# Patient Record
Sex: Female | Born: 1935 | Race: White | Hispanic: No | Smoking: Former smoker
Health system: Southern US, Community
[De-identification: ages and names within clinical notes are randomized; demographics above are authoritative.]

## PROBLEM LIST (undated history)

## (undated) DIAGNOSIS — I10 Essential (primary) hypertension: Secondary | ICD-10-CM

## (undated) DIAGNOSIS — E785 Hyperlipidemia, unspecified: Secondary | ICD-10-CM

## (undated) DIAGNOSIS — E079 Disorder of thyroid, unspecified: Secondary | ICD-10-CM

## (undated) HISTORY — PX: EYE SURGERY: SHX253

## (undated) HISTORY — PX: APPENDECTOMY: SHX54

---

## 1980-03-20 HISTORY — PX: ABDOMINAL HYSTERECTOMY: SHX81

## 2001-03-20 DIAGNOSIS — I10 Essential (primary) hypertension: Secondary | ICD-10-CM | POA: Insufficient documentation

## 2001-09-30 DIAGNOSIS — M810 Age-related osteoporosis without current pathological fracture: Secondary | ICD-10-CM | POA: Insufficient documentation

## 2003-03-21 DIAGNOSIS — E039 Hypothyroidism, unspecified: Secondary | ICD-10-CM | POA: Insufficient documentation

## 2004-11-15 DIAGNOSIS — E785 Hyperlipidemia, unspecified: Secondary | ICD-10-CM | POA: Insufficient documentation

## 2005-08-24 ENCOUNTER — Ambulatory Visit: Payer: Self-pay

## 2005-09-18 ENCOUNTER — Ambulatory Visit: Payer: Self-pay | Admitting: Gastroenterology

## 2005-10-16 ENCOUNTER — Ambulatory Visit: Payer: Self-pay | Admitting: Gastroenterology

## 2007-04-15 ENCOUNTER — Ambulatory Visit: Payer: Self-pay | Admitting: Family Medicine

## 2007-04-15 LAB — HM DEXA SCAN

## 2008-09-01 ENCOUNTER — Ambulatory Visit: Payer: Self-pay | Admitting: Family Medicine

## 2008-11-04 ENCOUNTER — Ambulatory Visit: Payer: Self-pay | Admitting: Family Medicine

## 2010-12-20 ENCOUNTER — Emergency Department: Payer: Self-pay | Admitting: Emergency Medicine

## 2011-01-04 ENCOUNTER — Ambulatory Visit: Payer: Self-pay | Admitting: Gastroenterology

## 2011-01-04 LAB — HM COLONOSCOPY

## 2011-03-06 ENCOUNTER — Ambulatory Visit: Payer: Self-pay | Admitting: Orthopedic Surgery

## 2012-03-26 ENCOUNTER — Ambulatory Visit: Payer: Self-pay | Admitting: Family Medicine

## 2014-05-13 LAB — BASIC METABOLIC PANEL
BUN: 25 mg/dL — AB (ref 4–21)
Creatinine: 0.7 mg/dL (ref 0.5–1.1)
GLUCOSE: 107 mg/dL
Potassium: 4.7 mmol/L (ref 3.4–5.3)
Sodium: 139 mmol/L (ref 137–147)

## 2014-05-13 LAB — TSH: TSH: 0.78 u[IU]/mL (ref 0.41–5.90)

## 2014-05-13 LAB — LIPID PANEL
Cholesterol: 236 mg/dL — AB (ref 0–200)
HDL: 51 mg/dL (ref 35–70)
LDL CALC: 142 mg/dL
Triglycerides: 216 mg/dL — AB (ref 40–160)

## 2014-06-17 ENCOUNTER — Ambulatory Visit
Admit: 2014-06-17 | Disposition: A | Discharge status: Home / Self Care | Payer: Self-pay | Attending: Ophthalmology | Admitting: Ophthalmology

## 2014-08-20 ENCOUNTER — Other Ambulatory Visit: Payer: Self-pay | Admitting: Family Medicine

## 2014-09-17 ENCOUNTER — Ambulatory Visit: Payer: Self-pay | Admitting: Family Medicine

## 2014-09-22 ENCOUNTER — Ambulatory Visit: Payer: Self-pay | Admitting: Family Medicine

## 2014-10-05 ENCOUNTER — Other Ambulatory Visit: Payer: Self-pay | Admitting: Family Medicine

## 2014-10-15 ENCOUNTER — Other Ambulatory Visit: Payer: Self-pay | Admitting: Family Medicine

## 2015-01-04 ENCOUNTER — Other Ambulatory Visit: Payer: Self-pay | Admitting: Family Medicine

## 2015-02-26 ENCOUNTER — Other Ambulatory Visit: Payer: Self-pay | Admitting: Family Medicine

## 2015-04-17 ENCOUNTER — Other Ambulatory Visit: Payer: Self-pay | Admitting: Family Medicine

## 2015-05-18 ENCOUNTER — Other Ambulatory Visit: Payer: Self-pay | Admitting: Family Medicine

## 2015-05-18 NOTE — Telephone Encounter (Signed)
Please advise patient she needs to schedule follow up office visit before we can refill her medications.

## 2015-05-20 NOTE — Telephone Encounter (Signed)
Left message for patient to call back to schedule an office visit 

## 2015-05-21 DIAGNOSIS — G8929 Other chronic pain: Secondary | ICD-10-CM | POA: Insufficient documentation

## 2015-05-21 DIAGNOSIS — R51 Headache: Secondary | ICD-10-CM

## 2015-05-21 DIAGNOSIS — E559 Vitamin D deficiency, unspecified: Secondary | ICD-10-CM | POA: Insufficient documentation

## 2015-05-21 NOTE — Telephone Encounter (Signed)
Pt returned call and scheduled appt for 05/24/15. Thanks TNP

## 2015-05-24 ENCOUNTER — Encounter: Payer: Self-pay | Admitting: Family Medicine

## 2015-05-24 ENCOUNTER — Ambulatory Visit (INDEPENDENT_AMBULATORY_CARE_PROVIDER_SITE_OTHER): Payer: PPO | Admitting: Family Medicine

## 2015-05-24 VITALS — BP 130/68 | HR 63 | Temp 97.4°F | Resp 18 | Ht 61.0 in | Wt 132.0 lb

## 2015-05-24 DIAGNOSIS — E039 Hypothyroidism, unspecified: Secondary | ICD-10-CM

## 2015-05-24 DIAGNOSIS — R42 Dizziness and giddiness: Secondary | ICD-10-CM | POA: Diagnosis not present

## 2015-05-24 DIAGNOSIS — M8580 Other specified disorders of bone density and structure, unspecified site: Secondary | ICD-10-CM | POA: Diagnosis not present

## 2015-05-24 DIAGNOSIS — E785 Hyperlipidemia, unspecified: Secondary | ICD-10-CM | POA: Diagnosis not present

## 2015-05-24 DIAGNOSIS — E559 Vitamin D deficiency, unspecified: Secondary | ICD-10-CM

## 2015-05-24 DIAGNOSIS — E2839 Other primary ovarian failure: Secondary | ICD-10-CM | POA: Diagnosis not present

## 2015-05-24 DIAGNOSIS — I10 Essential (primary) hypertension: Secondary | ICD-10-CM

## 2015-05-24 NOTE — Progress Notes (Signed)
Patient: Sophia Anderson Female    DOB: [birth year]   80 y.o.   MRN: [id number] Visit Date: 05/24/2015  Today's Provider: Lelon Huh, MD   Chief Complaint  Patient presents with  . Hypertension  . Hypothyroidism  . Hyperlipidemia   Subjective:    HPI   Hypertension, follow-up:  BP Readings from Last 3 Encounters:  05/24/15 130/68  07/07/14 160/60    She was last seen for hypertension 1 years ago.  BP at that visit was 110/60. Management since that visit includes no changes. She reports good compliance with treatment. She is not having side effects.  She is not exercising. She is not adherent to low salt diet.   Outside blood pressures are not being checked. She is experiencing none.  Patient denies chest pain, chest pressure/discomfort, claudication, dyspnea, exertional chest pressure/discomfort, fatigue, irregular heart beat and lower extremity edema.   Cardiovascular risk factors include dyslipidemia and hypertension.  Use of agents associated with hypertension: none.     Weight trend: stable Wt Readings from Last 3 Encounters:  05/24/15 132 lb (59.875 kg)  07/07/14 134 lb (60.782 kg)    Current diet: well balanced  ------------------------------------------------------------------------   Lipid/Cholesterol, Follow-up:   Last seen for this1 years ago.  Management changes since that visit include none. . Last Lipid Panel:    Component Value Date/Time   CHOL 236* 05/13/2014   TRIG 216* 05/13/2014   HDL 51 05/13/2014   [place] 142 05/13/2014    Risk factors for vascular disease include hypercholesterolemia and hypertension  She reports good compliance with treatment. She is not having side effects.  Current symptoms include none and have been stable. Weight trend: stable Prior visit with dietician: no Current diet: well balanced Current exercise: none  Wt Readings from Last 3 Encounters:  05/24/15 132 lb (59.875 kg)  07/07/14 134 lb  (60.782 kg)    ------------------------------------------------------------------- Follow up Hypothyroidism:  Last office visit was 05/13/2014 and no changes were made. Patient reports good compliance with treatment.  Follow up Vitamin D deficiency:  Last office visit was 1 year ago and no changes were made. Patient reports good compliance with treatment.     Allergies  Allergen Reactions  . Fluvastatin Sodium     Heartburn  . Penicillins   . Welchol  [Colesevelam Hcl]     Heartburn   Previous Medications   ALBUTEROL (PROAIR HFA) 108 (90 BASE) MCG/ACT INHALER    Inhale into the lungs.   LEVOTHYROXINE (SYNTHROID, LEVOTHROID) 88 MCG TABLET    Take 1 tablet (88 mcg total) by mouth daily.   MONTELUKAST (SINGULAIR) 10 MG TABLET    TAKE 1 TABLET BY MOUTH DAILY   PROPRANOLOL ER (INDERAL LA) 60 MG 24 HR CAPSULE    Take by mouth.   TRIAMCINOLONE (NASACORT ALLERGY 24HR) 55 MCG/ACT AERO NASAL INHALER    Place into the nose.   VITAMIN D, ERGOCALCIFEROL, (DRISDOL) 50000 UNITS CAPS CAPSULE    TAKE ONE CAPSULE BY MOUTH EVERY WEEK   ZETIA 10 MG TABLET    TAKE 1 TABLET BY MOUTH AT BEDTIME    Review of Systems  Constitutional: Negative for fever, chills, appetite change and fatigue.  HENT: Positive for rhinorrhea.   Respiratory: Negative for chest tightness and shortness of breath.   Cardiovascular: Negative for chest pain and palpitations.  Gastrointestinal: Negative for nausea, vomiting and abdominal pain.  Neurological: Positive for dizziness and light-headedness. Negative for weakness and headaches.  Psychiatric/Behavioral:  Patient has been more forgetful    Social History  Substance Use Topics  . Smoking status: Former Research scientist (life sciences)  . Smokeless tobacco: Not on file  . Alcohol Use: No   Objective:   BP 130/68 mmHg  Pulse 63  Temp(Src) 97.4 F (36.3 C) (Oral)  Resp 18  Ht 5\' 1"  (1.549 m)  Wt 132 lb (59.875 kg)  BMI 24.95 kg/m2  SpO2 100%  Physical Exam   General  Appearance:    Alert, cooperative, no distress  Eyes:    PERRL, conjunctiva/corneas clear, EOM's intact       Lungs:     Clear to auscultation bilaterally, respirations unlabored  Heart:    Regular rate and rhythm  Neurologic:   Awake, alert, oriented x 3. No apparent focal neurological           defect.      EKG: NSR      Assessment & Plan:     1. Essential (primary) hypertension Fairly well controlled on propranolol - EKG 12-Lead  2. Estrogen deficiency Due to BMD - DG Bone Density; Future  3. Hypothyroidism, unspecified hypothyroidism type  - TSH  4. Vitamin D deficiency - VITAMIN D 25 Hydroxy (Vit-D Deficiency, Fractures)  5. Osteopenia   6. HLD (hyperlipidemia)  - Lipid panel  7. Dizziness Resolved today.  - Comprehensive metabolic panel - CBC  8. Chronic headaches.  Well controlled on current betablocker  9. Forgetfulness.  Advised that this could be aggravated by betablocker, but her headaches have been so well controlled she is not anxious to change BP medications for now.       Lelon Huh, MD  [facility]

## 2015-05-25 ENCOUNTER — Encounter: Payer: Self-pay | Admitting: Family Medicine

## 2015-05-25 LAB — VITAMIN D 25 HYDROXY (VIT D DEFICIENCY, FRACTURES): VIT D 25 HYDROXY: 45.8 ng/mL (ref 30.0–100.0)

## 2015-05-25 LAB — CBC
Hematocrit: 40.2 % (ref 34.0–46.6)
Hemoglobin: 13.8 g/dL (ref 11.1–15.9)
MCH: 28.6 pg (ref 26.6–33.0)
MCHC: 34.3 g/dL (ref 31.5–35.7)
MCV: 83 fL (ref 79–97)
PLATELETS: 272 10*3/uL (ref 150–379)
RBC: 4.82 x10E6/uL (ref 3.77–5.28)
RDW: 14.6 % (ref 12.3–15.4)
WBC: 6.3 10*3/uL (ref 3.4–10.8)

## 2015-05-25 LAB — COMPREHENSIVE METABOLIC PANEL
ALK PHOS: 81 IU/L (ref 39–117)
ALT: 20 IU/L (ref 0–32)
AST: 23 IU/L (ref 0–40)
Albumin/Globulin Ratio: 1.6 (ref 1.1–2.5)
Albumin: 4.4 g/dL (ref 3.5–4.8)
BILIRUBIN TOTAL: 0.7 mg/dL (ref 0.0–1.2)
BUN / CREAT RATIO: 30 — AB (ref 11–26)
BUN: 22 mg/dL (ref 8–27)
CHLORIDE: 100 mmol/L (ref 96–106)
CO2: 23 mmol/L (ref 18–29)
CREATININE: 0.73 mg/dL (ref 0.57–1.00)
Calcium: 9.7 mg/dL (ref 8.7–10.3)
GFR calc Af Amer: 92 mL/min/{1.73_m2} (ref >59)
GFR calc non Af Amer: 80 mL/min/{1.73_m2} (ref >59)
GLOBULIN, TOTAL: 2.8 g/dL (ref 1.5–4.5)
Glucose: 115 mg/dL — ABNORMAL HIGH (ref 65–99)
Potassium: 4.8 mmol/L (ref 3.5–5.2)
SODIUM: 140 mmol/L (ref 134–144)
Total Protein: 7.2 g/dL (ref 6.0–8.5)

## 2015-05-25 LAB — TSH: TSH: 1.39 u[IU]/mL (ref 0.450–4.500)

## 2015-05-25 LAB — LIPID PANEL
CHOLESTEROL TOTAL: 262 mg/dL — AB (ref 100–199)
Chol/HDL Ratio: 4.5 ratio units — ABNORMAL HIGH (ref 0.0–4.4)
HDL: 58 mg/dL (ref >39)
LDL CALC: 172 mg/dL — AB (ref 0–99)
TRIGLYCERIDES: 161 mg/dL — AB (ref 0–149)
VLDL CHOLESTEROL CAL: 32 mg/dL (ref 5–40)

## 2015-05-31 ENCOUNTER — Encounter: Payer: Self-pay | Admitting: Family Medicine

## 2015-06-14 ENCOUNTER — Ambulatory Visit: Payer: Self-pay | Attending: Family Medicine

## 2015-06-18 ENCOUNTER — Other Ambulatory Visit: Payer: Self-pay | Admitting: Family Medicine

## 2015-06-19 ENCOUNTER — Other Ambulatory Visit: Payer: Self-pay | Admitting: Family Medicine

## 2015-09-27 ENCOUNTER — Ambulatory Visit (INDEPENDENT_AMBULATORY_CARE_PROVIDER_SITE_OTHER): Payer: PPO | Admitting: Family Medicine

## 2015-09-27 VITALS — BP 152/78 | HR 68 | Temp 98.2°F | Resp 16 | Wt 135.0 lb

## 2015-09-27 DIAGNOSIS — E785 Hyperlipidemia, unspecified: Secondary | ICD-10-CM

## 2015-09-27 DIAGNOSIS — R7303 Prediabetes: Secondary | ICD-10-CM | POA: Diagnosis not present

## 2015-09-27 DIAGNOSIS — R739 Hyperglycemia, unspecified: Secondary | ICD-10-CM | POA: Diagnosis not present

## 2015-09-27 LAB — POCT GLYCOSYLATED HEMOGLOBIN (HGB A1C)
ESTIMATED AVERAGE GLUCOSE: 123
Hemoglobin A1C: 5.9

## 2015-09-27 NOTE — Progress Notes (Signed)
Patient: Sophia Anderson Female    DOB: [birth year]   80 y.o.   MRN: [id number] Visit Date: 09/27/2015  Today's Provider: Lelon Huh, MD   Chief Complaint  Patient presents with  . Hypertension    follow up  . Hypothyroidism    follow up  . Hyperlipidemia    follow up  . Hyperglycemia    follow up   Subjective:    HPI  Hypertension, follow-up:  BP Readings from Last 3 Encounters:  09/27/15 152/78  05/24/15 130/68  07/07/14 160/60    She was last seen for hypertension 4 months ago.  BP at that visit was 130/68. Management since that visit includes no changes. She reports good compliance with treatment. She is not having side effects.  She is not exercising. She is not adherent to low salt diet.   Outside blood pressures are not being checked. She is experiencing dyspnea, fatigue and lower extremity edema.  Patient denies chest pain, claudication, irregular heart beat, near-syncope, orthopnea, palpitations, paroxysmal nocturnal dyspnea, syncope and tachypnea.   Cardiovascular risk factors include advanced age (older than 36 for men, 53 for women), dyslipidemia and hypertension.  Use of agents associated with hypertension: thyroid hormones.     Weight trend: stable Wt Readings from Last 3 Encounters:  09/27/15 135 lb (61.236 kg)  05/24/15 132 lb (59.875 kg)  07/07/14 134 lb (60.782 kg)    Current diet: in general, an "unhealthy" diet  ------------------------------------------------------------------------  Follow up Hypothyroidism: Last office visit was 4 months ago and no changes were made. Patient reports good complaince with treatment, good tolerance and good symptom control.   Lab Results  Component Value Date   TSH 1.390 05/24/2015        Lipid/Cholesterol, Follow-up:   Last seen for this4 months ago.  Management changes since that visit include advising patient to cut back on saturated fats and continue Zetia. . Last Lipid Panel:      Component Value Date/Time   CHOL 262* 05/24/2015 1103   CHOL 236* 05/13/2014   TRIG 161* 05/24/2015 1103   HDL 58 05/24/2015 1103   HDL 51 05/13/2014   CHOLHDL 4.5* 05/24/2015 1103   LDLCALC 172* 05/24/2015 1103   LDLCALC 142 05/13/2014    Risk factors for vascular disease include hypercholesterolemia and hypertension  She reports good compliance with treatment. States she is taking Zetia every day with no adverse effects.  She is not having side effects.   Weight trend: stable Prior visit with dietician: no Current diet: in general, an "unhealthy" diet Current exercise: none  Wt Readings from Last 3 Encounters:  09/27/15 135 lb (61.236 kg)  05/24/15 132 lb (59.875 kg)  07/07/14 134 lb (60.782 kg)    -------------------------------------------------------------------  Hyperglycemia, Follow-up:   Lab Results  Component Value Date   GLUCOSE 115* 05/24/2015    Last seen for for this 4 months ago.  Management since then includes advising patient to avoid sweets and starchy foods. Current symptoms include burning in the bottom of her feet at night and have been stable.  Weight trend: stable Prior visit with dietician: no Current diet: in general, an "unhealthy" diet Current exercise: none  Pertinent Labs:    Component Value Date/Time   CHOL 262* 05/24/2015 1103   CHOL 236* 05/13/2014   TRIG 161* 05/24/2015 1103   CHOLHDL 4.5* 05/24/2015 1103   CREATININE 0.73 05/24/2015 1103   CREATININE 0.7 05/13/2014    Wt Readings from Last  3 Encounters:  09/27/15 135 lb (61.236 kg)  05/24/15 132 lb (59.875 kg)  07/07/14 134 lb (60.782 kg)         Allergies  Allergen Reactions  . Fluvastatin Sodium     Heartburn  . Penicillins   . Welchol  [Colesevelam Hcl]     Heartburn   Current Meds  Medication Sig  . albuterol (PROAIR HFA) 108 (90 Base) MCG/ACT inhaler Inhale into the lungs.  Marland Kitchen levothyroxine (SYNTHROID, LEVOTHROID) 88 MCG tablet Take 1 tablet (88 mcg  total) by mouth daily.  . montelukast (SINGULAIR) 10 MG tablet TAKE 1 TABLET BY MOUTH DAILY  . propranolol ER (INDERAL LA) 60 MG 24 hr capsule TAKE ONE CAPSULE BY MOUTH EVERY DAY  . triamcinolone (NASACORT ALLERGY 24HR) 55 MCG/ACT AERO nasal inhaler Place into the nose.  . Vitamin D, Ergocalciferol, (DRISDOL) 50000 UNITS CAPS capsule TAKE ONE CAPSULE BY MOUTH EVERY WEEK  . ZETIA 10 MG tablet TAKE 1 TABLET BY MOUTH AT BEDTIME    Review of Systems  Constitutional: Negative for fever, chills, appetite change and fatigue.  Respiratory: Positive for shortness of breath (when climbing stairs). Negative for chest tightness.   Cardiovascular: Positive for leg swelling (lower right leg). Negative for chest pain and palpitations.  Gastrointestinal: Negative for nausea, vomiting and abdominal pain.  Musculoskeletal: Positive for arthralgias (right knee pain when walking or driving a car).  Neurological: Negative for dizziness and weakness.       Burning pain in feet at night.     Social History  Substance Use Topics  . Smoking status: Former Research scientist (life sciences)  . Smokeless tobacco: Not on file     Comment: Smoked < 1 ppd and quit many years ago  . Alcohol Use: No   Objective:   BP 152/78 mmHg  Pulse 68  Temp(Src) 98.2 F (36.8 C) (Oral)  Resp 16  Wt 135 lb (61.236 kg)  Physical Exam   General Appearance:    Alert, cooperative, no distress  Eyes:    PERRL, conjunctiva/corneas clear, EOM's intact       Lungs:     Clear to auscultation bilaterally, respirations unlabored  Heart:    Regular rate and rhythm  Neurologic:   Awake, alert, oriented x 3. No apparent focal neurological           defect.       Results for orders placed or performed in visit on 09/27/15  POCT HgB A1C  Result Value Ref Range   Hemoglobin A1C 5.9    Est. average glucose Bld gHb Est-mCnc 123        Assessment & Plan:     1. Hyperglycemia Counseled on healthy low glycemic index diet and exercise daily - POCT HgB  A1C  2. HLD (hyperlipidemia) Uncontrolled when last checked. Has tried to make some improvments with diet.  - Lipid panel  3. Prediabetes Check A1c 1-2 times per year.   4. Knee swelling Is not having any pain and swelling has mostly resolved. She thinks she may have twisted it. Advised to call if not continue to steadily improve.       Lelon Huh, MD  [facility]

## 2015-09-28 DIAGNOSIS — E785 Hyperlipidemia, unspecified: Secondary | ICD-10-CM | POA: Diagnosis not present

## 2015-09-29 LAB — LIPID PANEL
CHOLESTEROL TOTAL: 244 mg/dL — AB (ref 100–199)
Chol/HDL Ratio: 5.2 ratio units — ABNORMAL HIGH (ref 0.0–4.4)
HDL: 47 mg/dL (ref >39)
LDL Calculated: 147 mg/dL — ABNORMAL HIGH (ref 0–99)
TRIGLYCERIDES: 249 mg/dL — AB (ref 0–149)
VLDL Cholesterol Cal: 50 mg/dL — ABNORMAL HIGH (ref 5–40)

## 2015-10-05 ENCOUNTER — Telehealth: Payer: Self-pay

## 2015-10-05 NOTE — Telephone Encounter (Signed)
Pt advised.   Thanks,   -Travis Purk  

## 2015-10-05 NOTE — Telephone Encounter (Signed)
-----   Message from Birdie Sons, MD sent at 10/03/2015  9:15 PM EDT ----- LDL cholesterol is improved from 172 to 147. Continue current dose of Zetia. Check yearly.

## 2015-10-18 ENCOUNTER — Other Ambulatory Visit: Payer: Self-pay | Admitting: Family Medicine

## 2016-02-11 ENCOUNTER — Other Ambulatory Visit: Payer: Self-pay | Admitting: Family Medicine

## 2016-02-17 ENCOUNTER — Other Ambulatory Visit: Payer: Self-pay | Admitting: Family Medicine

## 2016-03-15 ENCOUNTER — Ambulatory Visit (INDEPENDENT_AMBULATORY_CARE_PROVIDER_SITE_OTHER): Payer: PPO

## 2016-03-15 VITALS — BP 130/58 | HR 72 | Temp 97.7°F | Ht 61.0 in | Wt 135.0 lb

## 2016-03-15 DIAGNOSIS — Z23 Encounter for immunization: Secondary | ICD-10-CM

## 2016-03-15 DIAGNOSIS — Z0000 Encounter for general adult medical examination without abnormal findings: Secondary | ICD-10-CM | POA: Diagnosis not present

## 2016-03-15 NOTE — Patient Instructions (Signed)
Sophia Anderson , Thank you for taking time to come for your Medicare Wellness Visit. I appreciate your ongoing commitment to your health goals. Please review the following plan we discussed and let me know if I can assist you in the future.   These are the goals we discussed: Goals    . Increase water intake          Starting 03/15/16, I will increase my water intake to 3 glasses a day.       This is a list of the screening recommended for you and due dates:  Health Maintenance  Topic Date Due  . Tetanus Vaccine  09/16/1956  . Shingles Vaccine  09/16/1997  . Flu Shot  10/19/2015  . DEXA scan (bone density measurement)  Completed  . Pneumonia vaccines  Completed   Preventive Care for Adults  A healthy lifestyle and preventive care can promote health and wellness. Preventive health guidelines for adults include the following key practices.  . A routine yearly physical is a good way to check with your health care provider about your health and preventive screening. It is a chance to share any concerns and updates on your health and to receive a thorough exam.  . Visit your dentist for a routine exam and preventive care every 6 months. Brush your teeth twice a day and floss once a day. Good oral hygiene prevents tooth decay and gum disease.  . The frequency of eye exams is based on your age, health, family medical history, use  of contact lenses, and other factors. Follow your health care provider's ecommendations for frequency of eye exams.  . Eat a healthy diet. Foods like vegetables, fruits, whole grains, low-fat dairy products, and lean protein foods contain the nutrients you need without too many calories. Decrease your intake of foods high in solid fats, added sugars, and salt. Eat the right amount of calories for you. Get information about a proper diet from your health care provider, if necessary.  . Regular physical exercise is one of the most important things you can do for your  health. Most adults should get at least 150 minutes of moderate-intensity exercise (any activity that increases your heart rate and causes you to sweat) each week. In addition, most adults need muscle-strengthening exercises on 2 or more days a week.  Silver Sneakers may be a benefit available to you. To determine eligibility, you may visit the website: www.silversneakers.com or contact program at [phone] Mon-Fri between 8AM-8PM.   . Maintain a healthy weight. The body mass index (BMI) is a screening tool to identify possible weight problems. It provides an estimate of body fat based on height and weight. Your health care provider can find your BMI and can help you achieve or maintain a healthy weight.   For adults 20 years and older: ? A BMI below 18.5 is considered underweight. ? A BMI of 18.5 to 24.9 is normal. ? A BMI of 25 to 29.9 is considered overweight. ? A BMI of 30 and above is considered obese.   . Maintain normal blood lipids and cholesterol levels by exercising and minimizing your intake of saturated fat. Eat a balanced diet with plenty of fruit and vegetables. Blood tests for lipids and cholesterol should begin at age 1 and be repeated every 5 years. If your lipid or cholesterol levels are high, you are over 50, or you are at high risk for heart disease, you may need your cholesterol levels checked more frequently. Ongoing  high lipid and cholesterol levels should be treated with medicines if diet and exercise are not working.  . If you smoke, find out from your health care provider how to quit. If you do not use tobacco, please do not start.  . If you choose to drink alcohol, please do not consume more than 2 drinks per day. One drink is considered to be 12 ounces (355 mL) of beer, 5 ounces (148 mL) of wine, or 1.5 ounces (44 mL) of liquor.  . If you are 82-45 years old, ask your health care provider if you should take aspirin to prevent strokes.  . Use sunscreen. Apply  sunscreen liberally and repeatedly throughout the day. You should seek shade when your shadow is shorter than you. Protect yourself by wearing long sleeves, pants, a wide-brimmed hat, and sunglasses year round, whenever you are outdoors.  . Once a month, do a whole body skin exam, using a mirror to look at the skin on your back. Tell your health care provider of new moles, moles that have irregular borders, moles that are larger than a pencil eraser, or moles that have changed in shape or color.

## 2016-03-15 NOTE — Progress Notes (Signed)
Subjective:   Sophia Anderson is a 80 y.o. female who presents for Medicare Annual (Subsequent) preventive examination.  Review of Systems:  N/A  Cardiac Risk Factors include: advanced age (>54men, >50 women);dyslipidemia;hypertension     Objective:     Vitals: BP (!) 130/58 (BP Location: Right Arm)   Pulse 72   Temp 97.7 F (36.5 C) (Oral)   Ht 5\' 1"  (1.549 m)   Wt 135 lb (61.2 kg)   BMI 25.51 kg/m   Body mass index is 25.51 kg/m.   Tobacco History  Smoking Status  . Former Smoker  . Types: Cigarettes  Smokeless Tobacco  . Never Used    Comment: Smoked < 1 ppd and quit 30+ years ago     Counseling given: Not Answered   History reviewed. No pertinent past medical history. Past Surgical History:  Procedure Laterality Date  . ABDOMINAL HYSTERECTOMY  1982   vaginal and oophorectomy with incidental appendectomy  . APPENDECTOMY     accidental durin hysterectomy and oophorectomy  . EYE SURGERY     left eye cataract surgery   Family History  Problem Relation Age of Onset  . Emphysema Father   . Congestive Heart Failure Sister    History  Sexual Activity  . Sexual activity: Not on file    Outpatient Encounter Prescriptions as of 03/15/2016  Medication Sig  . ezetimibe (ZETIA) 10 MG tablet TAKE 1 TABLET BY MOUTH AT BEDTIME  . levothyroxine (SYNTHROID, LEVOTHROID) 88 MCG tablet TAKE 1 TABLET (88 MCG TOTAL) BY MOUTH DAILY.  Marland Kitchen propranolol ER (INDERAL LA) 60 MG 24 hr capsule TAKE ONE CAPSULE BY MOUTH EVERY DAY  . triamcinolone (NASACORT ALLERGY 24HR) 55 MCG/ACT AERO nasal inhaler Place into the nose.   . Vitamin D, Ergocalciferol, (DRISDOL) 50000 units CAPS capsule TAKE ONE CAPSULE BY MOUTH ONCE WEEKLY  . albuterol (PROAIR HFA) 108 (90 Base) MCG/ACT inhaler Inhale into the lungs.  . montelukast (SINGULAIR) 10 MG tablet TAKE 1 TABLET BY MOUTH DAILY (Patient not taking: Reported on 03/15/2016)   No facility-administered encounter medications on file as of  03/15/2016.     Activities of Daily Living In your present state of health, do you have any difficulty performing the following activities: 03/15/2016  Hearing? N  Vision? Y  Difficulty concentrating or making decisions? Y  Walking or climbing stairs? N  Dressing or bathing? N  Doing errands, shopping? N  Preparing Food and eating ? N  Using the Toilet? N  In the past six months, have you accidently leaked urine? N  Do you have problems with loss of bowel control? N  Managing your Medications? N  Managing your Finances? N  Housekeeping or managing your Housekeeping? N  Some recent data might be hidden    Patient Care Team: Birdie Sons, MD as PCP - General (Family Medicine)    Assessment:     Exercise Activities and Dietary recommendations Current Exercise Habits: The patient has a physically strenous job, but has no regular exercise apart from work., Exercise limited by: None identified (busy watching grandson)  Goals    . Increase water intake          Starting 03/15/16, I will increase my water intake to 3 glasses a day.      Fall Risk Fall Risk  03/15/2016  Falls in the past year? No   Depression Screen PHQ 2/9 Scores 03/15/2016  PHQ - 2 Score 1     Cognitive Function  6CIT Screen 03/15/2016  What Year? 0 points  What month? 0 points  What time? 0 points  Count back from 20 0 points  Months in reverse 4 points  Repeat phrase 4 points  Total Score 8    Immunization History  Administered Date(s) Administered  . Influenza, High Dose Seasonal PF 03/15/2016  . Pneumococcal Conjugate-13 09/12/2013  . Pneumococcal Polysaccharide-23 02/27/2011   Screening Tests Health Maintenance  Topic Date Due  . TETANUS/TDAP  03/15/2017 (Originally 09/16/1954)  . ZOSTAVAX  03/15/2026 (Originally 09/16/1997)  . INFLUENZA VACCINE  Completed  . DEXA SCAN  Completed  . PNA vac Low Risk Adult  Completed      Plan:  I have personally reviewed and addressed the  Medicare Annual Wellness questionnaire and have noted the following in the patient's chart:  A. Medical and social history B. Use of alcohol, tobacco or illicit drugs  C. Current medications and supplements D. Functional ability and status E.  Nutritional status F.  Physical activity G. Advance directives H. List of other physicians I.  Hospitalizations, surgeries, and ER visits in previous 12 months J.  [place] such as hearing and vision if needed, cognitive and depression L. Referrals and appointments - none  In addition, I have reviewed and discussed with patient certain preventive protocols, quality metrics, and best practice recommendations. A written personalized care plan for preventive services as well as general preventive health recommendations were provided to patient.  See attached scanned questionnaire for additional information.   Signed,  Fabio Neighbors, LPN Nurse Health Advisor   MD Recommendations: follow up on tdap vaccine. Pt declined today.   I have reviewed the health advisor's note, was available for consultation, and agree with documentation and plan  Lelon Huh, MD

## 2016-04-06 ENCOUNTER — Encounter: Payer: PPO | Admitting: Family Medicine

## 2016-05-14 ENCOUNTER — Other Ambulatory Visit: Payer: Self-pay | Admitting: Family Medicine

## 2016-08-25 ENCOUNTER — Other Ambulatory Visit: Payer: Self-pay | Admitting: Family Medicine

## 2016-10-20 ENCOUNTER — Other Ambulatory Visit: Payer: Self-pay | Admitting: Family Medicine

## 2016-10-23 ENCOUNTER — Other Ambulatory Visit: Payer: Self-pay | Admitting: Family Medicine

## 2016-11-23 ENCOUNTER — Other Ambulatory Visit: Payer: Self-pay | Admitting: Family Medicine

## 2017-02-02 ENCOUNTER — Telehealth: Payer: Self-pay | Admitting: Family Medicine

## 2017-02-12 ENCOUNTER — Ambulatory Visit (INDEPENDENT_AMBULATORY_CARE_PROVIDER_SITE_OTHER): Payer: PPO | Admitting: Family Medicine

## 2017-02-12 DIAGNOSIS — Z23 Encounter for immunization: Secondary | ICD-10-CM | POA: Diagnosis not present

## 2017-02-12 NOTE — Progress Notes (Signed)
Flu shot only

## 2017-02-19 NOTE — Telephone Encounter (Signed)
Pt returned call & is scheduled for CPE on 02/23/17. Thanks TNP

## 2017-02-20 ENCOUNTER — Telehealth: Payer: Self-pay | Admitting: Family Medicine

## 2017-02-22 ENCOUNTER — Other Ambulatory Visit: Payer: Self-pay | Admitting: Family Medicine

## 2017-02-22 ENCOUNTER — Ambulatory Visit (INDEPENDENT_AMBULATORY_CARE_PROVIDER_SITE_OTHER): Payer: PPO

## 2017-02-22 VITALS — BP 158/72 | HR 68 | Temp 98.7°F | Ht 61.0 in | Wt 132.6 lb

## 2017-02-22 DIAGNOSIS — Z0000 Encounter for general adult medical examination without abnormal findings: Secondary | ICD-10-CM | POA: Diagnosis not present

## 2017-02-22 NOTE — Patient Instructions (Signed)
Sophia Anderson , Thank you for taking time to come for your Medicare Wellness Visit. I appreciate your ongoing commitment to your health goals. Please review the following plan we discussed and let me know if I can assist you in the future.   Screening recommendations/referrals: Colonoscopy: up to date Recommended yearly ophthalmology/optometry visit for glaucoma screening and checkup Recommended yearly dental visit for hygiene and checkup  Vaccinations: Influenza vaccine: up to date Pneumococcal vaccine: up to date Tdap vaccine: up to date Shingles vaccine: declined  Advanced directives: Please bring a copy of your POA (Power of Attorney) and/or Living Will to your next appointment.   Conditions/risks identified: Recommend increasing water intake to 4 glasses a day.   Next appointment: 02/23/17 @ 10:00 AM  Preventive Care 65 Years and Older, Female Preventive care refers to lifestyle choices and visits with your health care provider that can promote health and wellness. What does preventive care include?  A yearly physical exam. This is also called an annual well check.  Dental exams once or twice a year.  Routine eye exams. Ask your health care provider how often you should have your eyes checked.  Personal lifestyle choices, including:  Daily care of your teeth and gums.  Regular physical activity.  Eating a healthy diet.  Avoiding tobacco and drug use.  Limiting alcohol use.  Practicing safe sex.  Taking low doses of aspirin every day.  Taking vitamin and mineral supplements as recommended by your health care provider. What happens during an annual well check? The services and screenings done by your health care provider during your annual well check will depend on your age, overall health, lifestyle risk factors, and family history of disease. Counseling  Your health care provider may ask you questions about your:  Alcohol use.  Tobacco use.  Drug  use.  Emotional well-being.  Home and relationship well-being.  Sexual activity.  Eating habits.  History of falls.  Memory and ability to understand (cognition).  Work and work Statistician. Screening  You may have the following tests or measurements:  Height, weight, and BMI.  Blood pressure.  Lipid and cholesterol levels. These may be checked every 5 years, or more frequently if you are over 52 years old.  Skin check.  Lung cancer screening. You may have this screening every year starting at age 42 if you have a 30-pack-year history of smoking and currently smoke or have quit within the past 15 years.  Fecal occult blood test (FOBT) of the stool. You may have this test every year starting at age 71.  Flexible sigmoidoscopy or colonoscopy. You may have a sigmoidoscopy every 5 years or a colonoscopy every 10 years starting at age 21.  Prostate cancer screening. Recommendations will vary depending on your family history and other risks.  Hepatitis C blood test.  Hepatitis B blood test.  Sexually transmitted disease (STD) testing.  Diabetes screening. This is done by checking your blood sugar (glucose) after you have not eaten for a while (fasting). You may have this done every 1-3 years.  Abdominal aortic aneurysm (AAA) screening. You may need this if you are a current or former smoker.  Osteoporosis. You may be screened starting at age 5 if you are at high risk. Talk with your health care provider about your test results, treatment options, and if necessary, the need for more tests. Vaccines  Your health care provider may recommend certain vaccines, such as:  Influenza vaccine. This is recommended every year.  Tetanus, diphtheria, and acellular pertussis (Tdap, Td) vaccine. You may need a Td booster every 10 years.  Zoster vaccine. You may need this after age 11.  Pneumococcal 13-valent conjugate (PCV13) vaccine. One dose is recommended after age  6.  Pneumococcal polysaccharide (PPSV23) vaccine. One dose is recommended after age 69. Talk to your health care provider about which screenings and vaccines you need and how often you need them. This information is not intended to replace advice given to you by your health care provider. Make sure you discuss any questions you have with your health care provider. Document Released: 04/02/2015 Document Revised: 11/24/2015 Document Reviewed: 01/05/2015 Elsevier Interactive Patient Education  2017 [place] Prevention in the Home Falls can cause injuries. They can happen to people of all ages. There are many things you can do to make your home safe and to help prevent falls. What can I do on the outside of my home?  Regularly fix the edges of walkways and driveways and fix any cracks.  Remove anything that might make you trip as you walk through a door, such as a raised step or threshold.  Trim any bushes or trees on the path to your home.  Use bright outdoor lighting.  Clear any walking paths of anything that might make someone trip, such as rocks or tools.  Regularly check to see if handrails are loose or broken. Make sure that both sides of any steps have handrails.  Any raised decks and porches should have guardrails on the edges.  Have any leaves, snow, or ice cleared regularly.  Use sand or salt on walking paths during winter.  Clean up any spills in your garage right away. This includes oil or grease spills. What can I do in the bathroom?  Use night lights.  Install grab bars by the toilet and in the tub and shower. Do not use towel bars as grab bars.  Use non-skid mats or decals in the tub or shower.  If you need to sit down in the shower, use a plastic, non-slip stool.  Keep the floor dry. Clean up any water that spills on the floor as soon as it happens.  Remove soap buildup in the tub or shower regularly.  Attach bath mats securely with double-sided  non-slip rug tape.  Do not have throw rugs and other things on the floor that can make you trip. What can I do in the bedroom?  Use night lights.  Make sure that you have a light by your bed that is easy to reach.  Do not use any sheets or blankets that are too big for your bed. They should not hang down onto the floor.  Have a firm chair that has side arms. You can use this for support while you get dressed.  Do not have throw rugs and other things on the floor that can make you trip. What can I do in the kitchen?  Clean up any spills right away.  Avoid walking on wet floors.  Keep items that you use a lot in easy-to-reach places.  If you need to reach something above you, use a strong step stool that has a grab bar.  Keep electrical cords out of the way.  Do not use floor polish or wax that makes floors slippery. If you must use wax, use non-skid floor wax.  Do not have throw rugs and other things on the floor that can make you trip. What can I do  with my stairs?  Do not leave any items on the stairs.  Make sure that there are handrails on both sides of the stairs and use them. Fix handrails that are broken or loose. Make sure that handrails are as long as the stairways.  Check any carpeting to make sure that it is firmly attached to the stairs. Fix any carpet that is loose or worn.  Avoid having throw rugs at the top or bottom of the stairs. If you do have throw rugs, attach them to the floor with carpet tape.  Make sure that you have a light switch at the top of the stairs and the bottom of the stairs. If you do not have them, ask someone to add them for you. What else can I do to help prevent falls?  Wear shoes that:  Do not have high heels.  Have rubber bottoms.  Are comfortable and fit you well.  Are closed at the toe. Do not wear sandals.  If you use a stepladder:  Make sure that it is fully opened. Do not climb a closed stepladder.  Make sure that both  sides of the stepladder are locked into place.  Ask someone to hold it for you, if possible.  Clearly mark and make sure that you can see:  Any grab bars or handrails.  First and last steps.  Where the edge of each step is.  Use tools that help you move around (mobility aids) if they are needed. These include:  Canes.  Walkers.  Scooters.  Crutches.  Turn on the lights when you go into a dark area. Replace any light bulbs as soon as they burn out.  Set up your furniture so you have a clear path. Avoid moving your furniture around.  If any of your floors are uneven, fix them.  If there are any pets around you, be aware of where they are.  Review your medicines with your doctor. Some medicines can make you feel dizzy. This can increase your chance of falling. Ask your doctor what other things that you can do to help prevent falls. This information is not intended to replace advice given to you by your health care provider. Make sure you discuss any questions you have with your health care provider. Document Released: 12/31/2008 Document Revised: 08/12/2015 Document Reviewed: 04/10/2014 Elsevier Interactive Patient Education  2017 [employer].

## 2017-02-22 NOTE — Progress Notes (Signed)
Subjective:   Sophia Anderson is a 81 y.o. female who presents for Medicare Annual (Subsequent) preventive examination.  Review of Systems:  N/A  Cardiac Risk Factors include: advanced age (>81men, >89 women);dyslipidemia;hypertension     Objective:     Vitals: BP (!) 158/72 (BP Location: Left Arm)   Pulse 68   Temp 98.7 F (37.1 C) (Oral)   Ht 5\' 1"  (1.549 m)   Wt 132 lb 9.6 oz (60.1 kg)   BMI 25.05 kg/m   Body mass index is 25.05 kg/m.  Advanced Directives 02/22/2017 03/15/2016  Does Patient Have a Medical Advance Directive? Yes Yes  Type of Paramedic of [place];Living will Living will;Healthcare Power of Nathalie in Chart? No - copy requested No - copy requested    Tobacco Social History   Tobacco Use  Smoking Status Former Smoker  . Types: Cigarettes  Smokeless Tobacco Never Used  Tobacco Comment   Smoked < 1 ppd and quit 30+ years ago     Counseling given: Not Answered Comment: Smoked < 1 ppd and quit 30+ years ago   Clinical Intake:  Pre-visit preparation completed: Yes  Pain : 0-10 Pain Score: 6  Pain Type: Chronic pain Pain Location: Shoulder Pain Orientation: Right Pain Descriptors / Indicators: Constant     Nutritional Status: BMI 25 -29 Overweight Nutritional Risks: None Diabetes: No  Activities of Daily Living: Independent Ambulation: Independent with device- listed below Home Assistive Devices/Equipment: Eyeglasses Medication Administration: Independent Home Management: Independent  Barriers to Care Management & Learning: None  Do you feel unsafe in your current relationship?: No(widowed) Do you feel physically threatened by others?: No Anyone hurting you at home, work, or school?: No Unable to ask?: No Information provided on Community resources: No  What is the last grade level you completed in school?: 11th grade  Interpreter Needed?: No  Information entered by  :: [facility], LPN  History reviewed. No pertinent past medical history. Past Surgical History:  Procedure Laterality Date  . ABDOMINAL HYSTERECTOMY  1982   vaginal and oophorectomy with incidental appendectomy  . APPENDECTOMY     accidental durin hysterectomy and oophorectomy  . EYE SURGERY     left eye cataract surgery   Family History  Problem Relation Age of Onset  . Emphysema Father   . Congestive Heart Failure Sister    Social History   Socioeconomic History  . Marital status: Widowed    Spouse name: None  . Number of children: 1  . Years of education: None  . Highest education level: None  Social Needs  . Financial resource strain: None  . Food insecurity - worry: None  . Food insecurity - inability: None  . Transportation needs - medical: None  . Transportation needs - non-medical: None  Occupational History  . 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 Smoking status: Former Smoker    Types: Cigarettes  . Smokeless tobacco: Never Used  . Tobacco comment: Smoked < 1 ppd and quit 30+ years ago  Substance and Sexual Activity  . Alcohol use: Yes    Alcohol/week: 0.0 oz    Comment: occasionally a glass of wine  . Drug use: No  . Sexual activity: None  Other Topics Concern  . None  Social History Narrative  . None    Outpatient Encounter Medications as of 02/22/2017  Medication Sig  .  albuterol (PROAIR HFA) 108 (90 Base) MCG/ACT inhaler Inhale 2 puffs into the lungs every 4 (four) hours as needed.   . ezetimibe (ZETIA) 10 MG tablet TAKE 1 TABLET BY MOUTH EVERYDAY AT BEDTIME  . levothyroxine (SYNTHROID, LEVOTHROID) 88 MCG tablet TAKE 1 TABLET (88 MCG TOTAL) BY MOUTH DAILY.  Marland Kitchen propranolol ER (INDERAL LA) 60 MG 24 hr capsule TAKE ONE CAPSULE BY MOUTH EVERY DAY  . Vitamin D, Ergocalciferol, (DRISDOL) 50000 units CAPS capsule TAKE ONE CAPSULE BY MOUTH ONCE WEEKLY  . triamcinolone (NASACORT ALLERGY 24HR) 55 MCG/ACT  AERO nasal inhaler Place into the nose.   . [DISCONTINUED] montelukast (SINGULAIR) 10 MG tablet TAKE 1 TABLET BY MOUTH DAILY (Patient not taking: Reported on 03/15/2016)   No facility-administered encounter medications on file as of 02/22/2017.     Activities of Daily Living In your present state of health, do you have any difficulty performing the following activities: 02/22/2017 03/15/2016  Hearing? N N  Vision? Y Y  Comment due to cataracts w/o glasses  Difficulty concentrating or making decisions? Y Y  Comment - some  Walking or climbing stairs? N N  Dressing or bathing? N N  Doing errands, shopping? N N  Preparing Food and eating ? N N  Using the Toilet? N N  In the past six months, have you accidently leaked urine? N N  Do you have problems with loss of bowel control? N N  Managing your Medications? N N  Managing your Finances? N N  Housekeeping or managing your Housekeeping? N N  Some recent data might be hidden    Patient Care Team: Birdie Sons, MD as PCP - General (Family Medicine)    Assessment:     Exercise Activities and Dietary recommendations Current Exercise Habits: The patient does not participate in regular exercise at present, Exercise limited by: Other - see comments(busy keeping 1 year old grandson)  Goals    . DIET - INCREASE WATER INTAKE     Recommend increasing water intake to 4 glasses a day.       Fall Risk Fall Risk  02/22/2017 03/15/2016  Falls in the past year? No No   Is the patient's home free of loose throw rugs in walkways, pet beds, electrical cords, etc?   yes      Grab bars in the bathroom? no      Handrails on the stairs?   yes      Adequate lighting?   yes  Depression Screen PHQ 2/9 Scores 02/22/2017 03/15/2016  PHQ - 2 Score 0 1     Cognitive Function: Pt declined screening today.     6CIT Screen 03/15/2016  What Year? 0 points  What month? 0 points  What time? 0 points  Count back from 20 0 points  Months in  reverse 4 points  Repeat phrase 4 points  Total Score 8    Immunization History  Administered Date(s) Administered  . Influenza, High Dose Seasonal PF 03/15/2016, 02/12/2017  . Pneumococcal Conjugate-13 09/12/2013  . Pneumococcal Polysaccharide-23 02/27/2011   Screening Tests Health Maintenance  Topic Date Due  . TETANUS/TDAP  03/15/2017 (Originally 09/16/1954)  . INFLUENZA VACCINE  Completed  . DEXA SCAN  Completed  . PNA vac Low Risk Adult  Completed   Cancer Screenings: Lung: Low Dose CT Chest recommended if Age 35-80 years, 30 pack-year currently smoking OR have quit w/in 15years. Patient does not qualify. Breast: Up to date on Mammogram? No, discussed this with pt  and she plans to set this up in 2019.  Up to date of Bone Density/Dexa? Yes Colorectal: up to date  Additional Screenings:  Hepatitis B/HIV/Syphillis: Pt declined this today. Hepatitis C Screening: Pt declined this today.      Plan:  I have personally reviewed and addressed the Medicare Annual Wellness questionnaire and have noted the following in the patient's chart:  A. Medical and social history B. Use of alcohol, tobacco or illicit drugs  C. Current medications and supplements D. Functional ability and status E.  Nutritional status F.  Physical activity G. Advance directives H. List of other physicians I.  Hospitalizations, surgeries, and ER visits in previous 12 months J.  [place] such as hearing and vision if needed, cognitive and depression L. Referrals and appointments - none  In addition, I have reviewed and discussed with patient certain preventive protocols, quality metrics, and best practice recommendations. A written personalized care plan for preventive services as well as general preventive health recommendations were provided to patient.  See attached scanned questionnaire for additional information.   Signed,  Fabio Neighbors, LPN Nurse Health Advisor   Nurse  Recommendations: None.

## 2017-02-22 NOTE — Telephone Encounter (Signed)
Pharmacy requesting refills. Thanks!  
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
## 2017-02-23 ENCOUNTER — Ambulatory Visit
Admission: RE | Admit: 2017-02-23 | Discharge: 2017-02-23 | Disposition: A | Discharge status: Home / Self Care | Payer: PPO | Source: Ambulatory Visit | Attending: Family Medicine | Admitting: Family Medicine

## 2017-02-23 ENCOUNTER — Ambulatory Visit (INDEPENDENT_AMBULATORY_CARE_PROVIDER_SITE_OTHER): Payer: PPO | Admitting: Family Medicine

## 2017-02-23 ENCOUNTER — Encounter: Payer: Self-pay | Admitting: Family Medicine

## 2017-02-23 VITALS — BP 164/70 | HR 47 | Temp 97.9°F | Resp 16 | Ht 61.0 in | Wt 132.0 lb

## 2017-02-23 DIAGNOSIS — Z0000 Encounter for general adult medical examination without abnormal findings: Secondary | ICD-10-CM | POA: Diagnosis not present

## 2017-02-23 DIAGNOSIS — Z1231 Encounter for screening mammogram for malignant neoplasm of breast: Secondary | ICD-10-CM

## 2017-02-23 DIAGNOSIS — E2839 Other primary ovarian failure: Secondary | ICD-10-CM | POA: Diagnosis not present

## 2017-02-23 DIAGNOSIS — R222 Localized swelling, mass and lump, trunk: Secondary | ICD-10-CM | POA: Diagnosis not present

## 2017-02-23 DIAGNOSIS — E785 Hyperlipidemia, unspecified: Secondary | ICD-10-CM | POA: Diagnosis not present

## 2017-02-23 DIAGNOSIS — R7303 Prediabetes: Secondary | ICD-10-CM | POA: Diagnosis not present

## 2017-02-23 DIAGNOSIS — E039 Hypothyroidism, unspecified: Secondary | ICD-10-CM | POA: Diagnosis not present

## 2017-02-23 DIAGNOSIS — I10 Essential (primary) hypertension: Secondary | ICD-10-CM | POA: Diagnosis not present

## 2017-02-23 DIAGNOSIS — E559 Vitamin D deficiency, unspecified: Secondary | ICD-10-CM | POA: Diagnosis not present

## 2017-02-23 DIAGNOSIS — R229 Localized swelling, mass and lump, unspecified: Secondary | ICD-10-CM | POA: Diagnosis present

## 2017-02-23 DIAGNOSIS — M7551 Bursitis of right shoulder: Secondary | ICD-10-CM | POA: Diagnosis not present

## 2017-02-23 DIAGNOSIS — I700 Atherosclerosis of aorta: Secondary | ICD-10-CM | POA: Insufficient documentation

## 2017-02-23 DIAGNOSIS — Z1239 Encounter for other screening for malignant neoplasm of breast: Secondary | ICD-10-CM | POA: Insufficient documentation

## 2017-02-23 DIAGNOSIS — R739 Hyperglycemia, unspecified: Secondary | ICD-10-CM | POA: Diagnosis not present

## 2017-02-23 MED ORDER — NAPROXEN 375 MG PO TABS: 375.0000 mg | ORAL_TABLET | Freq: Two times a day (BID) | ORAL | 2 refills | Status: DC | PRN

## 2017-02-23 NOTE — Patient Instructions (Signed)
   The CDC recommends two doses of Shingrix (the shingles vaccine) separated by 2 to 6 months for adults age 79 years and older. I recommend checking with your insurance plan regarding coverage for this vaccine.   

## 2017-02-24 LAB — CBC
HEMATOCRIT: 38.1 % (ref 35.0–45.0)
HEMOGLOBIN: 13.2 g/dL (ref 11.7–15.5)
MCH: 29.5 pg (ref 27.0–33.0)
MCHC: 34.6 g/dL (ref 32.0–36.0)
MCV: 85 fL (ref 80.0–100.0)
MPV: 11.9 fL (ref 7.5–12.5)
Platelets: 246 10*3/uL (ref 140–400)
RBC: 4.48 10*6/uL (ref 3.80–5.10)
RDW: 13.1 % (ref 11.0–15.0)
WBC: 7 10*3/uL (ref 3.8–10.8)

## 2017-02-24 LAB — LIPID PANEL
CHOL/HDL RATIO: 3.9 (calc) (ref <5.0)
Cholesterol: 225 mg/dL — ABNORMAL HIGH (ref <200)
HDL: 57 mg/dL (ref >50)
LDL Cholesterol (Calc): 130 mg/dL (calc) — ABNORMAL HIGH
NON-HDL CHOLESTEROL (CALC): 168 mg/dL — AB (ref <130)
TRIGLYCERIDES: 234 mg/dL — AB (ref <150)

## 2017-02-24 LAB — COMPLETE METABOLIC PANEL WITH GFR
AG RATIO: 1.4 (calc) (ref 1.0–2.5)
ALT: 14 U/L (ref 6–29)
AST: 19 U/L (ref 10–35)
Albumin: 4.2 g/dL (ref 3.6–5.1)
Alkaline phosphatase (APISO): 72 U/L (ref 33–130)
BILIRUBIN TOTAL: 0.7 mg/dL (ref 0.2–1.2)
BUN: 23 mg/dL (ref 7–25)
CALCIUM: 9.6 mg/dL (ref 8.6–10.4)
CHLORIDE: 104 mmol/L (ref 98–110)
CO2: 28 mmol/L (ref 20–32)
Creat: 0.7 mg/dL (ref 0.60–0.93)
GFR, Est African American: 96 mL/min/{1.73_m2} (ref >=60)
GFR, Est Non African American: 82 mL/min/{1.73_m2} (ref >=60)
GLUCOSE: 105 mg/dL — AB (ref 65–99)
Globulin: 2.9 g/dL (calc) (ref 1.9–3.7)
POTASSIUM: 4.2 mmol/L (ref 3.5–5.3)
Sodium: 140 mmol/L (ref 135–146)
Total Protein: 7.1 g/dL (ref 6.1–8.1)

## 2017-02-24 LAB — HEMOGLOBIN A1C
HEMOGLOBIN A1C: 5.7 %{Hb} — AB (ref <5.7)
MEAN PLASMA GLUCOSE: 117 (calc)
eAG (mmol/L): 6.5 (calc)

## 2017-02-24 LAB — VITAMIN D 25 HYDROXY (VIT D DEFICIENCY, FRACTURES): Vit D, 25-Hydroxy: 66 ng/mL (ref 30–100)

## 2017-02-24 LAB — TSH: TSH: 1.09 mIU/L (ref 0.40–4.50)

## 2017-02-27 ENCOUNTER — Telehealth: Payer: Self-pay | Admitting: *Deleted

## 2017-02-27 DIAGNOSIS — R2232 Localized swelling, mass and lump, left upper limb: Secondary | ICD-10-CM

## 2017-02-27 NOTE — Telephone Encounter (Signed)
Patient was notified of results. Expressed understanding. CT ordered. 

## 2017-02-27 NOTE — Telephone Encounter (Signed)
-----   Message from Birdie Sons, MD sent at 02/27/2017  3:04 PM EST ----- Xray of rib is normal. Lump. Lesion is most likely a lipoma, need to get CT chest wall with contrast for further evaluation.

## 2017-03-05 ENCOUNTER — Other Ambulatory Visit: Payer: Self-pay | Admitting: Family Medicine

## 2017-03-05 MED ORDER — ALBUTEROL SULFATE HFA 108 (90 BASE) MCG/ACT IN AERS: 2.0000 | INHALATION_SPRAY | RESPIRATORY_TRACT | 5 refills | Status: DC | PRN

## 2017-03-05 NOTE — Telephone Encounter (Signed)
[facility] faxed a request for a refill on the following medication. Thanks CC  albuterol (PROAIR HFA) 108 (90 Base) MCG/ACT inhaler

## 2017-03-05 NOTE — Telephone Encounter (Signed)
Please review. Thanks!  

## 2017-03-09 ENCOUNTER — Telehealth: Payer: Self-pay | Admitting: Family Medicine

## 2017-03-09 NOTE — Telephone Encounter (Signed)
Pt stated she was returning Sarah's call. Thanks TNP °

## 2017-03-16 ENCOUNTER — Ambulatory Visit: Payer: PPO

## 2017-04-04 DIAGNOSIS — H2511 Age-related nuclear cataract, right eye: Secondary | ICD-10-CM | POA: Diagnosis not present

## 2017-04-10 ENCOUNTER — Telehealth: Payer: Self-pay | Admitting: Family Medicine

## 2017-04-10 NOTE — Telephone Encounter (Signed)
CT scan of lump was ordered in December, but I have not gotten any results and do not see that it has been scheduled. Please check with patient to see if she needs this scheduled. Thanks.

## 2017-04-10 NOTE — Telephone Encounter (Signed)
LMOVM for pt to return call 

## 2017-04-10 NOTE — Telephone Encounter (Signed)
-----   Message from Birdie Sons, MD sent at 03/14/2017  9:08 AM EST ----- Regarding: FW: follo wup chest ct results   ----- Message ----- From: Birdie Sons, MD Sent: 03/08/2017 To: Birdie Sons, MD Subject: follo wup chest ct results

## 2017-04-11 NOTE — Telephone Encounter (Signed)
Patient stated that she canceled the CT scan. She does not want to get the scan done right now.

## 2017-04-12 ENCOUNTER — Ambulatory Visit: Payer: PPO

## 2017-04-12 ENCOUNTER — Other Ambulatory Visit: Payer: PPO

## 2017-04-18 ENCOUNTER — Other Ambulatory Visit: Payer: Self-pay | Admitting: Family Medicine

## 2017-04-25 NOTE — Telephone Encounter (Signed)
Visit complete.

## 2017-06-28 ENCOUNTER — Other Ambulatory Visit: Payer: Self-pay | Admitting: Family Medicine

## 2017-09-06 ENCOUNTER — Other Ambulatory Visit: Payer: Self-pay | Admitting: Family Medicine

## 2017-11-09 ENCOUNTER — Ambulatory Visit (INDEPENDENT_AMBULATORY_CARE_PROVIDER_SITE_OTHER): Payer: PPO | Admitting: Family Medicine

## 2017-11-09 ENCOUNTER — Encounter: Payer: Self-pay | Admitting: Family Medicine

## 2017-11-09 VITALS — BP 157/79 | HR 68 | Temp 98.3°F | Resp 16 | Wt 127.0 lb

## 2017-11-09 DIAGNOSIS — E559 Vitamin D deficiency, unspecified: Secondary | ICD-10-CM | POA: Diagnosis not present

## 2017-11-09 DIAGNOSIS — E785 Hyperlipidemia, unspecified: Secondary | ICD-10-CM | POA: Diagnosis not present

## 2017-11-09 DIAGNOSIS — R1084 Generalized abdominal pain: Secondary | ICD-10-CM

## 2017-11-09 NOTE — Progress Notes (Signed)
Patient: Sophia Anderson Female    DOB: 1935/11/14   82 y.o.   MRN: [id number] Visit Date: 11/09/2017  Today's Provider: Lelon Huh, MD   Chief Complaint  Patient presents with  . Abdominal Pain   Subjective:    Abdominal Pain  This is a chronic problem. The current episode started more than 1 month ago. The onset quality is sudden. The problem occurs every several days. The problem has been unchanged. The pain is located in the generalized abdominal region. The quality of the pain is sharp and cramping. The abdominal pain does not radiate. Associated symptoms include diarrhea, nausea and vomiting. Pertinent negatives include no anorexia, arthralgias, belching, constipation, dysuria, fever, flatus, frequency, headaches, hematochezia, hematuria, melena, myalgias or weight loss. The pain is relieved by vomiting and bowel movements.    Patient has been having generalized abdominal pain for around 9 months. Patient has also been having spotting or rectal bleeding for about 1 year. Patient states abdominal pain happens several times a month. Pain will be sharp and twisting. Pain will last 1-2 minutes. Patient states she will have sometimes have vomiting and diarrhea and symptoms will resolve. Pain is usually mid upper abdomen. Sometimes worsens after eating. She has not taken any OTC or prescription NSAIDs for several months.   Allergies  Allergen Reactions  . Fluvastatin Sodium     Heartburn  . Penicillins   . Welchol  [Colesevelam Hcl]     Heartburn     Current Outpatient Medications:  .  albuterol (PROAIR HFA) 108 (90 Base) MCG/ACT inhaler, Inhale 2 puffs into the lungs every 4 (four) hours as needed., Disp: 1 Inhaler, Rfl: 5 .  ezetimibe (ZETIA) 10 MG tablet, TAKE 1 TABLET BY MOUTH EVERY DAY AT BEDTIME, Disp: 30 tablet, Rfl: 11 .  levothyroxine (SYNTHROID, LEVOTHROID) 88 MCG tablet, TAKE 1 TABLET (88 MCG TOTAL) BY MOUTH DAILY., Disp: 30 tablet, Rfl: 11 .  propranolol ER  (INDERAL LA) 60 MG 24 hr capsule, TAKE 1 CAPSULE BY MOUTH EVERY DAY, Disp: 30 capsule, Rfl: 11 .  triamcinolone (NASACORT ALLERGY 24HR) 55 MCG/ACT AERO nasal inhaler, Place into the nose. , Disp: , Rfl:  .  Vitamin D, Ergocalciferol, (DRISDOL) 50000 units CAPS capsule, TAKE ONE CAPSULE BY MOUTH ONCE WEEKLY, Disp: 12 capsule, Rfl: 4 .  naproxen (NAPROSYN) 375 MG tablet, Take 1 tablet (375 mg total) by mouth 2 (two) times daily as needed for mild pain. Take with meals (Patient not taking: Reported on 11/09/2017), Disp: 30 tablet, Rfl: 2  Review of Systems  Constitutional: Negative for fever and weight loss.  Gastrointestinal: Positive for abdominal pain, diarrhea, nausea and vomiting. Negative for anorexia, constipation, flatus, hematochezia and melena.  Genitourinary: Negative for dysuria, frequency and hematuria.  Musculoskeletal: Negative for arthralgias and myalgias.  Neurological: Negative for headaches.    Social History   Tobacco Use  . Smoking status: Former Smoker    Types: Cigarettes  . Smokeless tobacco: Never Used  . Tobacco comment: Smoked < 1 ppd and quit 30+ years ago  Substance Use Topics  . Alcohol use: Yes    Alcohol/week: 0.0 standard drinks    Comment: occasionally a glass of wine   Objective:   BP (!) 157/79 (BP Location: Right Arm, Patient Position: Sitting, Cuff Size: Normal)   Pulse 68   Temp 98.3 F (36.8 C) (Oral)   Resp 16   Wt 127 lb (57.6 kg)   SpO2 99%   BMI  24.00 kg/m  Vitals:   11/09/17 1552  BP: (!) 157/79  Pulse: 68  Resp: 16  Temp: 98.3 F (36.8 C)  TempSrc: Oral  SpO2: 99%  Weight: 127 lb (57.6 kg)     Physical Exam  General Appearance:    Alert, cooperative, no distress  Eyes:    PERRL, conjunctiva/corneas clear, EOM's intact       Lungs:     Clear to auscultation bilaterally, respirations unlabored  Heart:    Regular rate and rhythm  Abdomen:   bowel sounds present and normal in all 4 quadrants, soft, round, nontender or  nondistended. No CVA tenderness        Assessment & Plan:     1. Generalized abdominal pain Mostly upper abdomen. No c/w diverticulitis. She does have history of external hemorrhoids which are probably the source of occasional blood in stool. If labs normal will obtain CT.   - CBC - Comprehensive metabolic panel - H Pylori, IGM, IGG, IGA AB - Amylase - Lipase  2. Hyperlipidemia, unspecified hyperlipidemia type Doing well with Zetia.   3. . Vitamin D deficiency  - VITAMIN D 25 Hydroxy (Vit-D Deficiency, Fractures)    Lelon Huh, MD  [facility]

## 2017-11-12 ENCOUNTER — Telehealth: Payer: Self-pay | Admitting: Family Medicine

## 2017-11-12 ENCOUNTER — Telehealth: Payer: Self-pay

## 2017-11-12 NOTE — Telephone Encounter (Signed)
-----   Message from Birdie Sons, MD sent at 11/12/2017 11:52 AM EDT ----- Labs are all completely normal. Need CT abdomen and pelvis without contrast for evaluation of abdominal pain.

## 2017-11-12 NOTE — Telephone Encounter (Signed)
Pt's granddaughter called to see if the lab work was back from her grandmothers visit Friday with Dr. Caryn Section  CB#  [phone]  Thank s  Con Memos

## 2017-11-12 NOTE — Telephone Encounter (Signed)
Tried calling patient on home phone number. No answer. Left message to call back. Patients granddaughter Erasmo Downer called requesting lab results, and I advised her of them. Erasmo Downer still wants [place] to advise the patient of the lab results and the recommendation of the CT. Erasmo Downer wants to be sure the patient wants to proceed with referral. Erasmo Downer says she would be willing to bring her grandmother to the appointment if her grandmother agrees to have it done.

## 2017-11-13 ENCOUNTER — Telehealth: Payer: Self-pay | Admitting: Family Medicine

## 2017-11-13 LAB — COMPREHENSIVE METABOLIC PANEL
A/G RATIO: 1.9 (ref 1.2–2.2)
ALT: 18 IU/L (ref 0–32)
AST: 20 IU/L (ref 0–40)
Albumin: 4.6 g/dL (ref 3.5–4.7)
Alkaline Phosphatase: 85 IU/L (ref 39–117)
BUN/Creatinine Ratio: 44 — ABNORMAL HIGH (ref 12–28)
BUN: 27 mg/dL (ref 8–27)
Bilirubin Total: 0.3 mg/dL (ref 0.0–1.2)
CALCIUM: 9.8 mg/dL (ref 8.7–10.3)
CHLORIDE: 100 mmol/L (ref 96–106)
CO2: 23 mmol/L (ref 20–29)
Creatinine, Ser: 0.61 mg/dL (ref 0.57–1.00)
GFR calc Af Amer: 99 mL/min/{1.73_m2} (ref >59)
GFR, EST NON AFRICAN AMERICAN: 86 mL/min/{1.73_m2} (ref >59)
GLUCOSE: 104 mg/dL — AB (ref 65–99)
Globulin, Total: 2.4 g/dL (ref 1.5–4.5)
POTASSIUM: 4.2 mmol/L (ref 3.5–5.2)
Sodium: 136 mmol/L (ref 134–144)
TOTAL PROTEIN: 7 g/dL (ref 6.0–8.5)

## 2017-11-13 LAB — CBC
Hematocrit: 38.9 % (ref 34.0–46.6)
Hemoglobin: 13.3 g/dL (ref 11.1–15.9)
MCH: 29 pg (ref 26.6–33.0)
MCHC: 34.2 g/dL (ref 31.5–35.7)
MCV: 85 fL (ref 79–97)
PLATELETS: 265 10*3/uL (ref 150–450)
RBC: 4.59 x10E6/uL (ref 3.77–5.28)
RDW: 14 % (ref 12.3–15.4)
WBC: 5.9 10*3/uL (ref 3.4–10.8)

## 2017-11-13 LAB — AMYLASE: Amylase: 83 U/L (ref 31–124)

## 2017-11-13 LAB — H PYLORI, IGM, IGG, IGA AB
H pylori, IgM Abs: <9 units (ref 0.0–8.9)
H. pylori, IgA Abs: 15.4 units — ABNORMAL HIGH (ref 0.0–8.9)
H. pylori, IgG AbS: <0.8 Index Value (ref 0.00–0.79)

## 2017-11-13 LAB — VITAMIN D 25 HYDROXY (VIT D DEFICIENCY, FRACTURES): Vit D, 25-Hydroxy: 42.8 ng/mL (ref 30.0–100.0)

## 2017-11-13 LAB — LIPASE: Lipase: 34 U/L (ref 14–85)

## 2017-11-13 MED ORDER — METRONIDAZOLE 500 MG PO TABS: 500.0000 mg | ORAL_TABLET | Freq: Three times a day (TID) | ORAL | 0 refills | Status: DC

## 2017-11-13 MED ORDER — OMEPRAZOLE 20 MG PO CPDR: 20.0000 mg | DELAYED_RELEASE_CAPSULE | Freq: Two times a day (BID) | ORAL | 0 refills | Status: DC

## 2017-11-13 MED ORDER — METRONIDAZOLE 500 MG PO TABS: 500.0000 mg | ORAL_TABLET | Freq: Two times a day (BID) | ORAL | 0 refills | Status: DC

## 2017-11-13 MED ORDER — CLARITHROMYCIN 500 MG PO TABS: 500.0000 mg | ORAL_TABLET | Freq: Two times a day (BID) | ORAL | 0 refills | Status: DC

## 2017-11-13 MED ORDER — AMOXICILLIN 500 MG PO CAPS: 1000.0000 mg | ORAL_CAPSULE | Freq: Two times a day (BID) | ORAL | 0 refills | Status: DC

## 2017-11-13 NOTE — Telephone Encounter (Signed)
Patient's granddaughter Sophia Anderson was notified of results. Expressed understanding. Rx's sent to pharmacy. Follow up ov scheduled for 12/04/2017 @4 :00 pm.

## 2017-11-13 NOTE — Telephone Encounter (Signed)
Pt's granddaughter Marita Kansas stated that pt is allergic to amoxicillin (AMOXIL) 500 MG capsule because she is allergic to penicillins. Marita Kansas is requesting a different medication be sent to pharmacy and a call back to advised which medication is going to be sent in. Marita [place] also, wanted to make sure this is listed on pt's allergy list. Please advise. Thanks TNP

## 2017-11-13 NOTE — Telephone Encounter (Signed)
-----   Message from Birdie Sons, MD sent at 11/13/2017  8:07 AM EDT ----- One of the tests for h. Pylori came back positive. Will treat this before deciding if CT is needed. Start amoxicillin 500mg  two tablets twice a day x 14 days, clarithromycin, 500mg  one tablet twice a day x 14 days. And omeprazole 20mg  one capsule twice a day x 14 days Schedule follow up in 3 weeks.

## 2017-11-13 NOTE — Telephone Encounter (Signed)
Please advise have sent new prescription to take metronidazole instead of penicillin.

## 2017-11-13 NOTE — Telephone Encounter (Signed)
Please advise 

## 2017-11-13 NOTE — Telephone Encounter (Signed)
Patient's granddaughter Carmell Austria was advised.

## 2017-12-04 ENCOUNTER — Ambulatory Visit (INDEPENDENT_AMBULATORY_CARE_PROVIDER_SITE_OTHER): Payer: PPO | Admitting: Family Medicine

## 2017-12-04 ENCOUNTER — Encounter: Payer: Self-pay | Admitting: Family Medicine

## 2017-12-04 VITALS — BP 160/79 | HR 71 | Temp 98.4°F | Resp 16 | Wt 129.0 lb

## 2017-12-04 DIAGNOSIS — Z23 Encounter for immunization: Secondary | ICD-10-CM

## 2017-12-04 DIAGNOSIS — R1084 Generalized abdominal pain: Secondary | ICD-10-CM

## 2017-12-04 DIAGNOSIS — I10 Essential (primary) hypertension: Secondary | ICD-10-CM | POA: Diagnosis not present

## 2017-12-04 DIAGNOSIS — K2970 Gastritis, unspecified, without bleeding: Secondary | ICD-10-CM

## 2017-12-04 DIAGNOSIS — B9681 Helicobacter pylori [H. pylori] as the cause of diseases classified elsewhere: Secondary | ICD-10-CM

## 2017-12-04 MED ORDER — AMLODIPINE BESYLATE 2.5 MG PO TABS: 2.5000 mg | ORAL_TABLET | Freq: Every day | ORAL | 2 refills | Status: DC

## 2017-12-04 NOTE — Progress Notes (Signed)
Patient: Sophia Anderson Female    DOB: [birth year]   82 y.o.   MRN: [id number] Visit Date: 12/04/2017  Today's Provider: Lelon Huh, MD   Chief Complaint  Patient presents with  . Follow-up   Subjective:    HPI H. Pylori: Patient was last seen for this problem 11/09/2017. During that visit labs were ordered due to patient experiencing abdominal pain, which showed positive for H. Pylori infection. Patient was treated and advised to follow up in 3 weeks. Today patient comes in stating that she took all doses of the prescribed medication except the last 2 days. She says that one of the medications was causing dizziness, so she stopped taking them all. She reports the abdominal pain has resolved.     Allergies  Allergen Reactions  . Fluvastatin Sodium     Heartburn  . Penicillins   . Welchol  [Colesevelam Hcl]     Heartburn     Current Outpatient Medications:  .  albuterol (PROAIR HFA) 108 (90 Base) MCG/ACT inhaler, Inhale 2 puffs into the lungs every 4 (four) hours as needed., Disp: 1 Inhaler, Rfl: 5 .  ezetimibe (ZETIA) 10 MG tablet, TAKE 1 TABLET BY MOUTH EVERY DAY AT BEDTIME, Disp: 30 tablet, Rfl: 11 .  levothyroxine (SYNTHROID, LEVOTHROID) 88 MCG tablet, TAKE 1 TABLET (88 MCG TOTAL) BY MOUTH DAILY., Disp: 30 tablet, Rfl: 11 .  propranolol ER (INDERAL LA) 60 MG 24 hr capsule, TAKE 1 CAPSULE BY MOUTH EVERY DAY, Disp: 30 capsule, Rfl: 11 .  triamcinolone (NASACORT ALLERGY 24HR) 55 MCG/ACT AERO nasal inhaler, Place into the nose. , Disp: , Rfl:  .  Vitamin D, Ergocalciferol, (DRISDOL) 50000 units CAPS capsule, TAKE ONE CAPSULE BY MOUTH ONCE WEEKLY, Disp: 12 capsule, Rfl: 4 .  clarithromycin (BIAXIN) 500 MG tablet, Take 1 tablet (500 mg total) by mouth 2 (two) times daily. (Patient not taking: Reported on 12/04/2017), Disp: 28 tablet, Rfl: 0 .  metroNIDAZOLE (FLAGYL) 500 MG tablet, Take 1 tablet (500 mg total) by mouth 2 (two) times daily. (Patient not taking: Reported on  12/04/2017), Disp: 28 tablet, Rfl: 0 .  omeprazole (PRILOSEC) 20 MG capsule, Take 1 capsule (20 mg total) by mouth 2 (two) times daily. (Patient not taking: Reported on 12/04/2017), Disp: 28 capsule, Rfl: 0  Review of Systems  Constitutional: Negative for appetite change, chills, fatigue and fever.  Respiratory: Negative for chest tightness and shortness of breath.   Cardiovascular: Negative for chest pain and palpitations.  Gastrointestinal: Negative for abdominal pain, nausea and vomiting.  Neurological: Negative for dizziness and weakness.    Social History   Tobacco Use  . Smoking status: Former Smoker    Types: Cigarettes  . Smokeless tobacco: Never Used  . Tobacco comment: Smoked < 1 ppd and quit 30+ years ago  Substance Use Topics  . Alcohol use: Not Currently    Alcohol/week: 0.0 standard drinks    Frequency: Never   Objective:   BP (!) 160/79 (BP Location: Left Arm, Patient Position: Sitting, Cuff Size: Normal)   Pulse 71   Temp 98.4 F (36.9 C) (Oral)   Resp 16   Wt 129 lb (58.5 kg)   SpO2 96% Comment: room air  BMI 24.37 kg/m     Physical Exam  General Appearance:    Alert, cooperative, no distress  Eyes:    PERRL, conjunctiva/corneas clear, EOM's intact       Abdomen:   bowel sounds present and normal  in all 4 quadrants, soft or nontender.        Assessment & Plan:     1. Helicobacter pylori gastritis Completed triple therapy. Need to test for cure.  - H. pylori breath test  2. Generalized abdominal pain  - H. pylori breath test  3. Essential (primary) hypertension Uncontrolled, add - amLODipine (NORVASC) 2.5 MG tablet; Take 1 tablet (2.5 mg total) by mouth daily.  Dispense: 30 tablet; Refill: 2  Return in about 3 months (around 03/05/2018).   4. Need for influenza vaccination  - Flu vaccine HIGH DOSE PF (Fluzone High dose)       Lelon Huh, MD  [facility]

## 2017-12-06 ENCOUNTER — Telehealth: Payer: Self-pay

## 2017-12-06 LAB — H. PYLORI BREATH TEST: H PYLORI BREATH TEST: NEGATIVE

## 2017-12-06 NOTE — Telephone Encounter (Signed)
LMTCB 12/03/2017  Thanks,   -Mickel Baas

## 2017-12-06 NOTE — Telephone Encounter (Signed)
-----   Message from Birdie Sons, MD sent at 12/06/2017  7:48 AM EDT ----- H. Pylori test is negative. No further medications or testing is needed.

## 2017-12-07 NOTE — Telephone Encounter (Signed)
Pt returned call ° °Thanks  teri °

## 2017-12-07 NOTE — Telephone Encounter (Signed)
Advised patient of results.  

## 2018-01-25 ENCOUNTER — Telehealth: Payer: Self-pay

## 2018-01-25 NOTE — Telephone Encounter (Signed)
LMTCB- unable to leave detailed message per DPR. Pt needs to schedule her AWV for anytime after 02/22/18. -MM

## 2018-03-01 ENCOUNTER — Ambulatory Visit: Payer: PPO

## 2018-03-05 ENCOUNTER — Other Ambulatory Visit: Payer: Self-pay | Admitting: Family Medicine

## 2018-03-05 DIAGNOSIS — M7551 Bursitis of right shoulder: Secondary | ICD-10-CM

## 2018-03-07 ENCOUNTER — Telehealth: Payer: Self-pay

## 2018-03-07 NOTE — Telephone Encounter (Signed)
Tried to contact pt to r/s previously cancelled AWV. NANM. Will try again at another time.  -MM

## 2018-03-09 ENCOUNTER — Other Ambulatory Visit: Payer: Self-pay | Admitting: Family Medicine

## 2018-03-15 NOTE — Telephone Encounter (Signed)
duplicate

## 2018-03-15 NOTE — Telephone Encounter (Signed)
LMTCB. Unable to leave detailed message per DPR. -MM

## 2018-03-25 NOTE — Telephone Encounter (Signed)
Spoke with granddaughter Marita Kansas (ok per DPR), and she states she will be seeing pt tomorrow and will advise her to call the office to set up her AWV. Gave her my direct line for pt to contact to set up apt.  -MM

## 2018-04-22 NOTE — Telephone Encounter (Signed)
I have tried to contact pt on several different occasions and left VMs requesting a CB. I have also spoken to granddaughter and asked that she have pt call me. Pt has NOT returned my calls. Closing TE. -MM

## 2018-05-28 ENCOUNTER — Other Ambulatory Visit: Payer: Self-pay | Admitting: Family Medicine

## 2018-06-13 ENCOUNTER — Other Ambulatory Visit: Payer: Self-pay | Admitting: Family Medicine

## 2018-07-05 IMAGING — CR DG RIBS 2V*L*
1 series · 2 of 2 positions shown · non-contrast
Comparison: Chest radiograph March 26, 2012

CLINICAL DATA: Soft tissue prominence anterior lower left rib
region

EXAM:
LEFT RIBS - 2 VIEW

[Series 1: dg ribs unilateral left · 0.14mm/px · 2 of 2 slices shown]
[im 1/2]
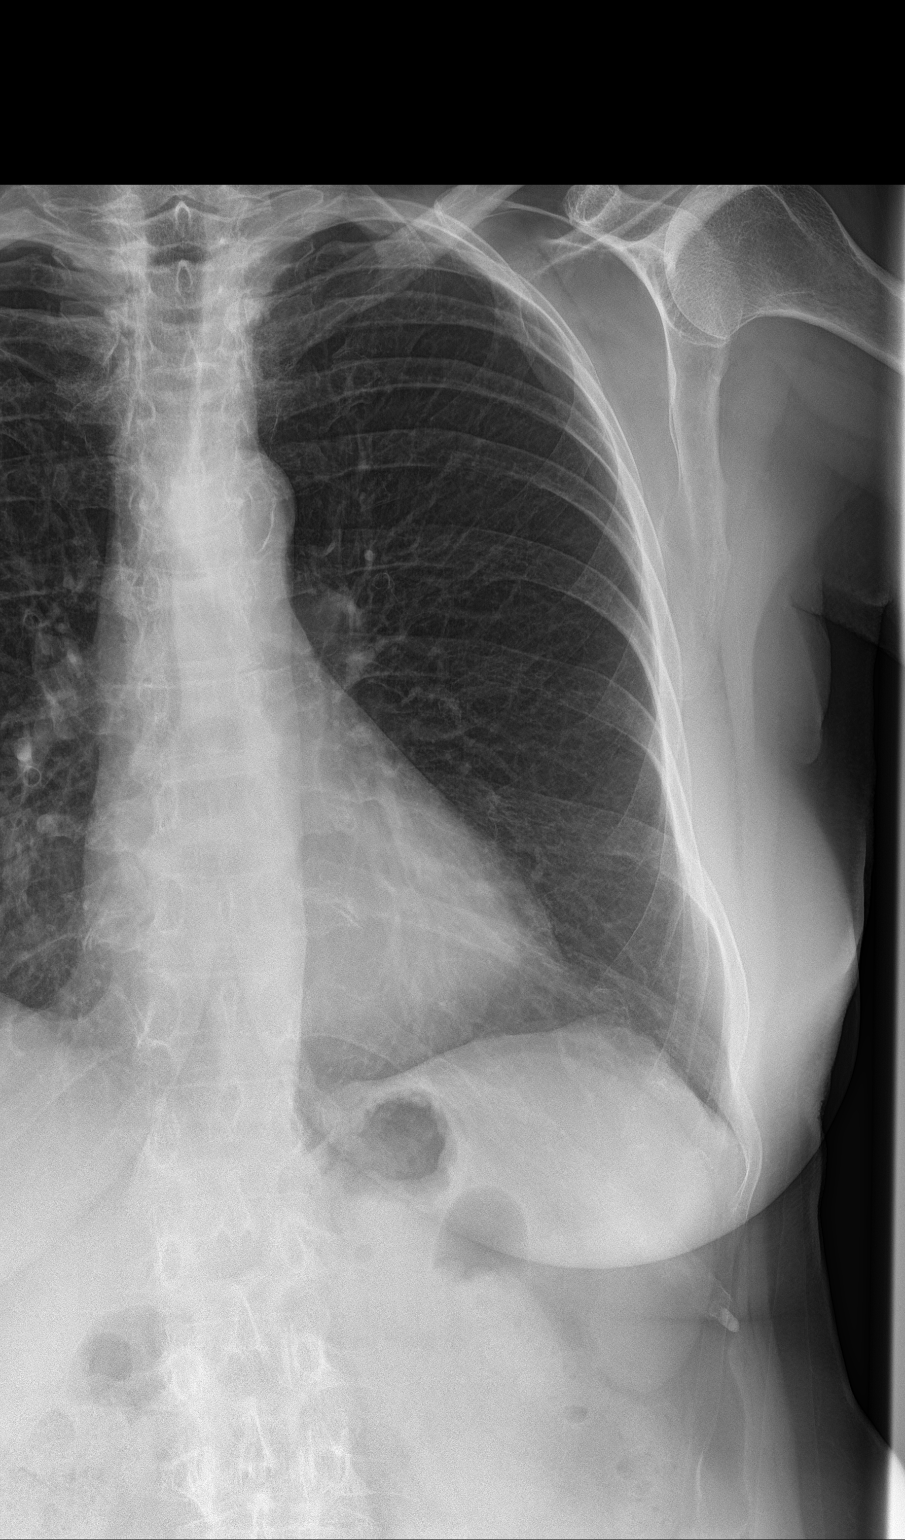
[im 2/2]
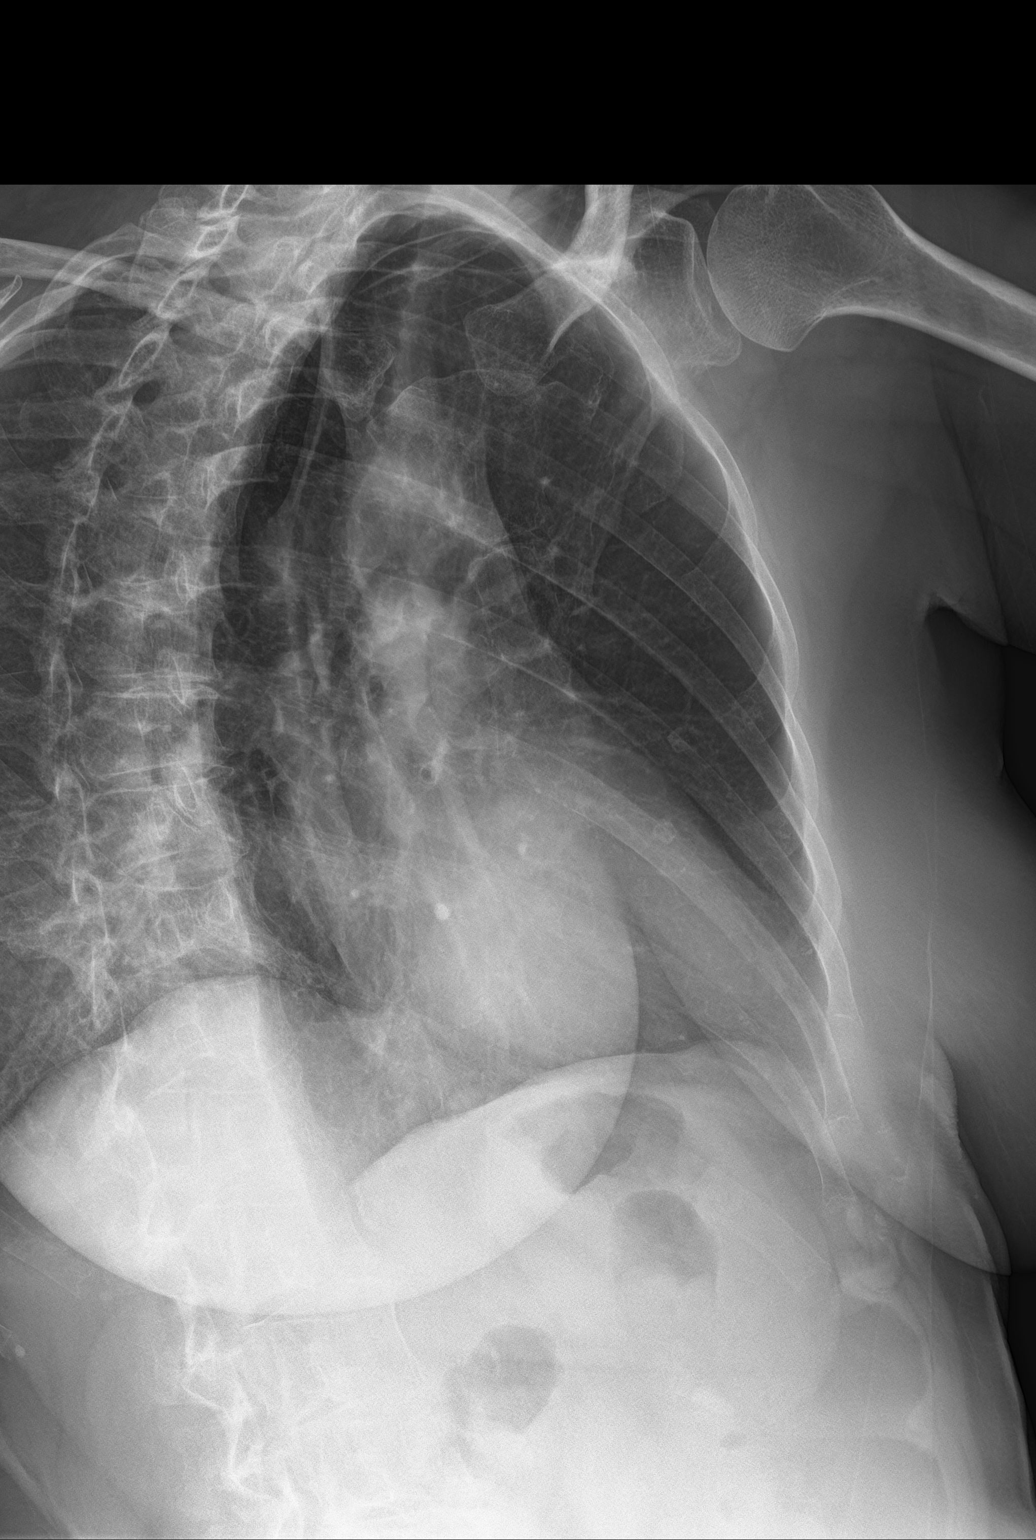

[2 of 2 positions shown; findings below may reference images not displayed]

FINDINGS: Frontal and oblique views of the left chest and ribs obtained. No
rib fracture is evident. No blastic or lytic rib lesions are
identified. No expansile lesions seen.

Left lung is clear. Heart size and pulmonary vascular normal. There
is aortic atherosclerosis. No adenopathy is seen in visualized
regions.

No well-defined chest wall lesion is evident by radiography.
IMPRESSION: No bony abnormality evident. No adenopathy in visualized regions.
Left lung clear. There is aortic atherosclerosis.

No chest wall mass is appreciable by radiography. If there remains
concern for potential lesion involving the chest wall, CT with
intravenous contrast would be advised to further assess.

Aortic Atherosclerosis ([id number]).

## 2018-08-31 ENCOUNTER — Other Ambulatory Visit: Payer: Self-pay | Admitting: Family Medicine

## 2018-10-17 ENCOUNTER — Other Ambulatory Visit: Payer: Self-pay

## 2019-01-28 DIAGNOSIS — Z961 Presence of intraocular lens: Secondary | ICD-10-CM | POA: Diagnosis not present

## 2019-02-18 ENCOUNTER — Other Ambulatory Visit: Payer: Self-pay | Admitting: Family Medicine

## 2019-02-18 NOTE — Telephone Encounter (Signed)
Message Received: Today   Message Contents   Sophia Anderson, Tawanna Solo, CMA Sent to [place]: Unspecified (Today,  3:14 PM)           Previous Messages    ----- Message from [place] sent at 02/18/2019  3:43 PM EST -----      ----- Message -----  From: Buel Ream, Surescripts Out   Sent: 02/18/2019   3:14 PM EST   To: Lelon Huh Rx Refill   Subject: Rx Auth Request                                  Request received via interface.            Refill request sent to me in error.          New medications from outside sources are available for reconciliation.      Requested Prescriptions     Name from pharmacy: PROPRANOLOL ER 60 [place]       Will file in chart as: propranolol ER (INDERAL LA) 60 MG 24 hr capsule   Sig: TAKE 1 CAPSULE BY MOUTH EVERY DAY   Disp:  90 capsule    Refills:  1   Start: 02/18/2019   Class: Normal   Non-formulary   Last ordered: 5 months ago by Birdie Sons, MD Last refill: 11/27/2018   Rx #: [id number]      Cardiovascular: Beta Blockers Failed12/03/2018 03:43 PMRerun the protocol   Failed  Last BP in normal range   Protocol Details   Failed  Valid encounter within last 6 months   Passed  Last Heart Rate in normal range     [id number] [address], [place]           Medication Refill         Sophia Anderson, Elena D, CMA routed conversation to [place] 39 minutes ago (3:43 PM)        Hendricks, Cayarel L, CMA Sent to [employer] 56 minutes ago (3:25 PM)                     Requested medication (s) are due for refill today: yes     Requested medication (s) are on the active medication list: yes     Last refill:  12/04/2017     Future visit scheduled: no     Notes to clinic:  Overdue for office visit   Review for refill               Requested Prescriptions    Pending  Prescriptions   Disp   Refills        propranolol ER (INDERAL LA) 60 MG 24 hr capsule [Pharmacy Med Name: PROPRANOLOL ER 60 MG CAPSULE]   90 capsule   1            Sig: TAKE 1 CAPSULE BY MOUTH EVERY DAY             Cardiovascular:  Beta Blockers Failed - 02/18/2019  3:14 PM                 Failed - Last BP in normal range                BP Readings from Last 1 Encounters:  12/04/17   (!) 160/79                      Failed - Valid encounter within last 6 months                 Recent Outpatient Visits                           1 year ago   Helicobacter pylori gastritis        [facility]   Birdie Sons, MD        1 year ago   Generalized abdominal pain        [facility]   Birdie Sons, MD        1 year ago   Annual physical exam        [facility]   Birdie Sons, MD        3 years ago   Hyperglycemia        [facility]   Birdie Sons, MD        3 years ago   Essential (primary) hypertension        [place], MD                                   Passed - Last Heart Rate in normal range                Pulse Readings from Last 1 Encounters:    12/04/17   71

## 2019-02-18 NOTE — Telephone Encounter (Signed)
Requested medication (s) are due for refill today: yes  Requested medication (s) are on the active medication list: yes  Last refill:  12/04/2017  Future visit scheduled: no  Notes to clinic:  Overdue for office visit  Review for refill   Requested Prescriptions  Pending Prescriptions Disp Refills   propranolol ER (INDERAL LA) 60 MG 24 hr capsule [Pharmacy Med Name: PROPRANOLOL ER 60 MG CAPSULE] 90 capsule 1    Sig: TAKE 1 CAPSULE BY MOUTH EVERY DAY     Cardiovascular:  Beta Blockers Failed - 02/18/2019  3:14 PM      Failed - Last BP in normal range    BP Readings from Last 1 Encounters:  12/04/17 (!) 160/79         Failed - Valid encounter within last 6 months    Recent Outpatient Visits          1 year ago Helicobacter pylori gastritis   [facility] Birdie Sons, MD   1 year ago Generalized abdominal pain   [facility] Birdie Sons, MD   1 year ago Annual physical exam   [facility] Birdie Sons, MD   3 years ago Hyperglycemia   [facility] Birdie Sons, MD   3 years ago Essential (primary) hypertension   [place], MD             Passed - Last Heart Rate in normal range    Pulse Readings from Last 1 Encounters:  12/04/17 71

## 2019-04-09 ENCOUNTER — Ambulatory Visit (INDEPENDENT_AMBULATORY_CARE_PROVIDER_SITE_OTHER): Payer: PPO

## 2019-04-09 ENCOUNTER — Ambulatory Visit: Payer: PPO | Admitting: Podiatry

## 2019-04-09 ENCOUNTER — Encounter: Payer: Self-pay | Admitting: Podiatry

## 2019-04-09 ENCOUNTER — Other Ambulatory Visit: Payer: Self-pay

## 2019-04-09 DIAGNOSIS — G5793 Unspecified mononeuropathy of bilateral lower limbs: Secondary | ICD-10-CM

## 2019-04-09 DIAGNOSIS — M778 Other enthesopathies, not elsewhere classified: Secondary | ICD-10-CM

## 2019-04-09 DIAGNOSIS — Q828 Other specified congenital malformations of skin: Secondary | ICD-10-CM

## 2019-04-09 MED ORDER — GABAPENTIN 100 MG PO CAPS: 100.0000 mg | ORAL_CAPSULE | Freq: Every day | ORAL | 3 refills | Status: DC
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
## 2019-05-05 ENCOUNTER — Ambulatory Visit (INDEPENDENT_AMBULATORY_CARE_PROVIDER_SITE_OTHER): Payer: PPO | Admitting: Podiatry

## 2019-05-05 ENCOUNTER — Encounter: Payer: Self-pay | Admitting: Podiatry

## 2019-05-05 ENCOUNTER — Other Ambulatory Visit: Payer: Self-pay

## 2019-05-05 DIAGNOSIS — M778 Other enthesopathies, not elsewhere classified: Secondary | ICD-10-CM

## 2019-05-05 DIAGNOSIS — G5793 Unspecified mononeuropathy of bilateral lower limbs: Secondary | ICD-10-CM | POA: Diagnosis not present

## 2019-05-05 NOTE — Progress Notes (Signed)
She presents today for follow-up of her neuropathy 100 mg of gabapentin at nighttime states that it helps a lot she does not wake up anymore.  Still has that painful area plantar aspect of her left foot.  Objective: Vital signs are stable she is alert and oriented x3.  Pulses are palpable.  Reactive hyperkeratotic lesion forefoot left plantarly.  Much decrease in thickness.  We applied chemical destruction last time.  Assessment: Resolving painful neuropathy with use of gabapentin porokeratosis.  Plan: Debridement of porokeratotic lesion today complete avulsion of the lesion follow-up with me as needed

## 2019-05-17 ENCOUNTER — Other Ambulatory Visit: Payer: Self-pay | Admitting: Family Medicine

## 2019-05-17 NOTE — Telephone Encounter (Signed)
Requested medications are due for refill today? Yes  Requested medications are on active medication list? Yes  Last Refill:   03/09/2018  #90 with 4 refills  Future visit scheduled? No  Notes to Clinic:  Medication protocol failed is past is overdue for lab work and office visit.

## 2019-05-19 ENCOUNTER — Other Ambulatory Visit: Payer: Self-pay | Admitting: Family Medicine

## 2019-05-19 DIAGNOSIS — I10 Essential (primary) hypertension: Secondary | ICD-10-CM

## 2019-05-19 NOTE — Telephone Encounter (Signed)
Tried calling patient on her home phone and emergency contact to advise her that she is over due for follow up appointment. No answer of voice message system. Last office visit was 12/04/2017.

## 2019-05-19 NOTE — Telephone Encounter (Signed)
LMTCB to schedule a follow up appointment  

## 2019-05-19 NOTE — Telephone Encounter (Signed)
Requested medication (s) are due for refill today:   Does not look like she has been taking this for the last year.  Requested medication (s) are on the active medication list:   Yes  Future visit scheduled:   No   Last ordered: 12/04/2017  #30  2 refills  Last BP out of range and does not have a valid encounter within the last 6 months.   Requested Prescriptions  Pending Prescriptions Disp Refills   amLODipine (NORVASC) 2.5 MG tablet [Pharmacy Med Name: AMLODIPINE BESYLATE 2.5 MG TAB] 90 tablet     Sig: TAKE 1 TABLET BY MOUTH EVERY DAY      Cardiovascular:  Calcium Channel Blockers Failed - 05/19/2019  2:43 PM      Failed - Last BP in normal range    BP Readings from Last 1 Encounters:  12/04/17 (!) 160/79          Failed - Valid encounter within last 6 months    Recent Outpatient Visits           1 year ago Helicobacter pylori gastritis   [facility] Birdie Sons, MD   1 year ago Generalized abdominal pain   [facility] Birdie Sons, MD   2 years ago Annual physical exam   [facility] Birdie Sons, MD   3 years ago Hyperglycemia   [facility] Birdie Sons, MD   3 years ago Essential (primary) hypertension   [facility] [place], Kirstie Peri, MD

## 2019-05-19 NOTE — Telephone Encounter (Signed)
Pt states that she doesn't need this medication.

## 2019-08-06 ENCOUNTER — Other Ambulatory Visit: Payer: Self-pay | Admitting: Family Medicine

## 2019-08-06 NOTE — Telephone Encounter (Signed)
Requested medications are due for refill today?  Yes - 50,000 IU strengths cannot be delegated.    Requested medications are on active medication list?  Yes  Last Refill:   05/28/2018  # 12 with 4 refills  Future visit scheduled? No   Notes to Clinic:   50,000 IU strengths cannot be delegated.  Last visit was 12/04/2017.  Last labs performed on 11/09/2017.

## 2019-08-10 ENCOUNTER — Other Ambulatory Visit: Payer: Self-pay | Admitting: Family Medicine

## 2019-08-10 NOTE — Telephone Encounter (Signed)
Requested medications are due for refill today?  Yes  Requested medications are on active medication list?  Yes  Last Refill:   05/19/2019  # 90 with no refills.  Notation made with this refill that patient needed to schedule an office visit.    Future visit scheduled? No   Notes to Clinic:  Medication failed RX refill protocol due to no valid encounter and no lab work within past year.  Last office visit was 12/04/2017.  Last TSH was performed on 02/23/2017.

## 2019-08-11 NOTE — Telephone Encounter (Signed)
Attempted to contact patient, no answer left a voicemail. Okay for PEC to advise patient she needs a follow up.

## 2019-08-11 NOTE — Telephone Encounter (Signed)
Requested medication (s) are due for refill today: yes  Requested medication (s) are on the active medication list: yes  Last refill: 05/19/19 #90  0 refills  Future visit scheduled:No  Called left VM to return call to office scheduling  Notes to clinic: Last TSH 02/23/2017,     Requested Prescriptions  Pending Prescriptions Disp Refills   levothyroxine (SYNTHROID) 88 MCG tablet [Pharmacy Med Name: LEVOTHYROXINE 88 MCG TABLET] 90 tablet 0    Sig: TAKE 1 TABLET BY MOUTH EVERY DAY      Endocrinology:  Hypothyroid Agents Failed - 08/11/2019 11:40 AM      Failed - TSH needs to be rechecked within 3 months after an abnormal result. Refill until TSH is due.      Failed - TSH in normal range and within 360 days    TSH  Date Value Ref Range Status  02/23/2017 1.09 0.40 - 4.50 mIU/L Final          Failed - Valid encounter within last 12 months    Recent Outpatient Visits           1 year ago Helicobacter pylori gastritis   [facility] Birdie Sons, MD   1 year ago Generalized abdominal pain   [facility] Birdie Sons, MD   2 years ago Annual physical exam   [facility] Birdie Sons, MD   3 years ago Hyperglycemia   [facility] Birdie Sons, MD   4 years ago Essential (primary) hypertension   [facility] [place], Kirstie Peri, MD

## 2019-08-20 ENCOUNTER — Other Ambulatory Visit: Payer: Self-pay | Admitting: Family Medicine

## 2019-08-20 DIAGNOSIS — I10 Essential (primary) hypertension: Secondary | ICD-10-CM

## 2019-08-20 NOTE — Telephone Encounter (Signed)
Requested medications are due for refill today?  Yes   Requested medications are on active medication list?  Yes  Last Refill:   05/19/2019  # 90 with no refills - Notation made indicating patient needed to schedule an office visit for follow-up.   Future visit scheduled?  No   Notes to Clinic:  Medication failed RX refill protocol due to no valid encounter in the last 6 months.  Last office visit was on 12/04/2017.

## 2019-09-03 ENCOUNTER — Other Ambulatory Visit: Payer: Self-pay | Admitting: Family Medicine

## 2019-09-03 ENCOUNTER — Telehealth: Payer: Self-pay | Admitting: Family Medicine

## 2019-09-03 DIAGNOSIS — M7551 Bursitis of right shoulder: Secondary | ICD-10-CM

## 2019-09-03 NOTE — Telephone Encounter (Signed)
Patient overdue for follow up appointment. Last office visit 12/04/2017.

## 2019-09-03 NOTE — Telephone Encounter (Signed)
[facility] for the following medications:  1. levothyroxine (SYNTHROID) 88 MCG tablet 2. naproxen (NAPROSYN) 375 MG tablet  LOV: 12/04/2017  Per telephone encounter earlier 09/03/2019 and the message on the last Rx that pt needed OV, I called pt, no answer. Left message for pt to return call. Pt needs to be schedule for follow up visit. Please advise. Thanks TNP

## 2019-09-03 NOTE — Telephone Encounter (Signed)
[facility] ref[facility] for the following medications:  naproxen (NAPROSYN) 375 MG tablet   Last Rx: 03/06/2018 LOV: 12/04/2017 Please advise. Thanks TNP

## 2019-09-05 ENCOUNTER — Other Ambulatory Visit: Payer: Self-pay | Admitting: Family Medicine

## 2019-09-05 DIAGNOSIS — M7551 Bursitis of right shoulder: Secondary | ICD-10-CM

## 2019-09-05 NOTE — Telephone Encounter (Signed)
Requested medication (s) are due for refill today: yes  Requested medication (s) are on the active medication list: yes  Last refill:  05/19/2019  Future visit scheduled: yes  Notes to clinic:  patient has appointment on 09/09/2019 Review for refill   Requested Prescriptions  Pending Prescriptions Disp Refills   levothyroxine (SYNTHROID) 88 MCG tablet [Pharmacy Med Name: LEVOTHYROXINE 88 MCG TABLET] 90 tablet 0    Sig: TAKE 1 TABLET BY MOUTH EVERY DAY      Endocrinology:  Hypothyroid Agents Failed - 09/05/2019 10:50 AM      Failed - TSH needs to be rechecked within 3 months after an abnormal result. Refill until TSH is due.      Failed - TSH in normal range and within 360 days    TSH  Date Value Ref Range Status  02/23/2017 1.09 0.40 - 4.50 mIU/L Final          Failed - Valid encounter within last 12 months    Recent Outpatient Visits           1 year ago Helicobacter pylori gastritis   [facility] Birdie Sons, MD   1 year ago Generalized abdominal pain   [facility] Birdie Sons, MD   2 years ago Annual physical exam   [facility] Birdie Sons, MD   3 years ago Hyperglycemia   [facility] Birdie Sons, MD   4 years ago Essential (primary) hypertension   [place], Kirstie Peri, MD       Future Appointments             In 4 days Fisher, Kirstie Peri, MD [facility] [place]

## 2019-09-05 NOTE — Telephone Encounter (Signed)
Requested medication (s) are due for refill today: yes  Requested medication (s) are on the active medication list: yes  Last refill: 10/25/2018  Future visit scheduled: yes  Notes to clinic:  Patient has appointment in 4 days Please review for short supply or does patient just need to wait until appointment   Requested Prescriptions  Pending Prescriptions Disp Refills   naproxen (NAPROSYN) 375 MG tablet [Pharmacy Med Name: NAPROXEN 375 MG TABLET] 30 tablet 12    Sig: TAKE 1 TABLET (375 MG TOTAL) BY MOUTH 2 (TWO) TIMES DAILY AS NEEDED FOR MILD PAIN. TAKE WITH MEALS      Analgesics:  NSAIDS Failed - 09/05/2019 10:54 AM      Failed - Cr in normal range and within 360 days    Creat  Date Value Ref Range Status  02/23/2017 0.70 0.60 - 0.93 mg/dL Final    Comment:    For patients >74 years of age, the reference limit for Creatinine is approximately 13% higher for people identified as African-American. .    Creatinine, Ser  Date Value Ref Range Status  11/09/2017 0.61 0.57 - 1.00 mg/dL Final          Failed - HGB in normal range and within 360 days    Hemoglobin  Date Value Ref Range Status  11/09/2017 13.3 11.1 - 15.9 g/dL Final          Failed - Valid encounter within last 12 months    Recent Outpatient Visits           1 year ago Helicobacter pylori gastritis   [facility] Birdie Sons, MD   1 year ago Generalized abdominal pain   [facility] Birdie Sons, MD   2 years ago Annual physical exam   [facility] Birdie Sons, MD   3 years ago Hyperglycemia   [facility] Birdie Sons, MD   4 years ago Essential (primary) hypertension   [place], Kirstie Peri, MD       Future Appointments             In 4 days Fisher, Kirstie Peri, MD [facility] [place] - Patient is not pregnant        ezetimibe (ZETIA) 10 MG tablet [Pharmacy Med Name:  EZETIMIBE 10 MG TABLET] 90 tablet 3    Sig: TAKE 1 TABLET BY MOUTH EVERYDAY AT BEDTIME      Cardiovascular:  Antilipid - Sterol Transport Inhibitors Failed - 09/05/2019 10:54 AM      Failed - Total Cholesterol in normal range and within 360 days    Cholesterol, Total  Date Value Ref Range Status  09/28/2015 244 (H) 100 - 199 mg/dL Final   Cholesterol  Date Value Ref Range Status  02/23/2017 225 (H) <200 mg/dL Final          Failed - LDL in normal range and within 360 days    LDL Cholesterol (Calc)  Date Value Ref Range Status  02/23/2017 130 (H) mg/dL (calc) Final    Comment:    Reference range: <100 . Desirable range <100 mg/dL for primary prevention;   <70 mg/dL for patients with CHD or diabetic patients  with > or = 2 CHD risk factors. Marland Kitchen LDL-C is now calculated using the Martin-Hopkins  calculation, which is a validated novel method providing  better accuracy than the Friedewald equation  in the  estimation of LDL-C.  Cresenciano Genre et al. Annamaria Helling. 3779;396(88): 2061-2068  (http://education.[employer].com/faq/FAQ164)           Failed - HDL in normal range and within 360 days    HDL  Date Value Ref Range Status  02/23/2017 57 >50 mg/dL Final  09/28/2015 47 >39 mg/dL Final          Failed - Triglycerides in normal range and within 360 days    Triglycerides  Date Value Ref Range Status  02/23/2017 234 (H) <150 mg/dL Final          Failed - Valid encounter within last 12 months    Recent Outpatient Visits           1 year ago Helicobacter pylori gastritis   [facility] Birdie Sons, MD   1 year ago Generalized abdominal pain   [facility] Birdie Sons, MD   2 years ago Annual physical exam   [facility] Birdie Sons, MD   3 years ago Hyperglycemia   [facility] Birdie Sons, MD   4 years ago Essential (primary) hypertension   [place], Kirstie Peri, MD        Future Appointments             In 4 days Fisher, Kirstie Peri, MD [facility], [place]

## 2019-09-09 ENCOUNTER — Other Ambulatory Visit: Payer: Self-pay

## 2019-09-09 ENCOUNTER — Ambulatory Visit (INDEPENDENT_AMBULATORY_CARE_PROVIDER_SITE_OTHER): Payer: PPO | Admitting: Family Medicine

## 2019-09-09 ENCOUNTER — Encounter: Payer: Self-pay | Admitting: Family Medicine

## 2019-09-09 VITALS — BP 144/72 | HR 92 | Temp 97.3°F | Ht 61.0 in | Wt 129.8 lb

## 2019-09-09 DIAGNOSIS — E039 Hypothyroidism, unspecified: Secondary | ICD-10-CM | POA: Diagnosis not present

## 2019-09-09 DIAGNOSIS — E559 Vitamin D deficiency, unspecified: Secondary | ICD-10-CM | POA: Diagnosis not present

## 2019-09-09 DIAGNOSIS — I10 Essential (primary) hypertension: Secondary | ICD-10-CM | POA: Diagnosis not present

## 2019-09-09 DIAGNOSIS — E785 Hyperlipidemia, unspecified: Secondary | ICD-10-CM | POA: Diagnosis not present

## 2019-09-09 DIAGNOSIS — E2839 Other primary ovarian failure: Secondary | ICD-10-CM | POA: Diagnosis not present

## 2019-09-09 NOTE — Patient Instructions (Signed)
.   Please review the attached list of medications and notify my office if there are any errors.   . Please bring a copy of your Covid vaccination card to your next appointment so we can update your medical record 

## 2019-09-09 NOTE — Progress Notes (Signed)
Established patient visit   Patient: Sophia Anderson   DOB: [birth year]   84 y.o. Female  MRN: [id number] Visit Date: 09/09/2019  Today's healthcare provider: Lelon Huh, MD   Chief Complaint  Patient presents with  . Hypertension  . Hypothyroidism  . Hyperlipidemia   Sophia Anderson as a scribe for Sophia Huh, MD.,have documented all relevant documentation on the behalf of Sophia Huh, MD,as directed by  Sophia Huh, MD while in the presence of Sophia Huh, MD.  Subjective    HPI Hypertension, follow-up  BP Readings from Last 3 Encounters:  09/09/19 (!) 145/77  12/04/17 (!) 160/79  11/09/17 (!) 157/79   Wt Readings from Last 3 Encounters:  09/09/19 129 lb 12.8 oz (58.9 kg)  12/04/17 129 lb (58.5 kg)  11/09/17 127 lb (57.6 kg)     She was last seen for hypertension over 1 years ago.  BP at that visit was see above. Management since that visit includes no change.  She reports fair compliance with treatment. Since her last visit she stopped taking propranolol since she was having trouble with her memory, and states her memory has improved since doing so.  She is not having side effects.  She is following a Regular diet. She is exercising. She does not smoke.  Use of agents associated with hypertension: thyroid hormones.   Outside blood pressures are not being checked. Symptoms: No chest pain No chest pressure  No palpitations No syncope  No dyspnea No orthopnea  No paroxysmal nocturnal dyspnea No lower extremity edema   Pertinent labs: Lab Results  Component Value Date   CHOL 225 (H) 02/23/2017   HDL 57 02/23/2017   LDLCALC 130 (H) 02/23/2017   TRIG 234 (H) 02/23/2017   CHOLHDL 3.9 02/23/2017   Lab Results  Component Value Date   NA 136 11/09/2017   K 4.2 11/09/2017   CREATININE 0.61 11/09/2017   GFRNONAA 86 11/09/2017   GFRAA 99 11/09/2017   GLUCOSE 104 (H) 11/09/2017     The ASCVD Risk score (Sophia DC Jr., et al., 2013) failed  to calculate for the following reasons:   The 2013 ASCVD risk score is only valid for ages 55 to 42   --------------------------------------------------------------------------------------------------- Hypothyroid, follow-up  Lab Results  Component Value Date   TSH 1.09 02/23/2017   TSH 1.390 05/24/2015   TSH 0.78 05/13/2014   Wt Readings from Last 3 Encounters:  09/09/19 129 lb 12.8 oz (58.9 kg)  12/04/17 129 lb (58.5 kg)  11/09/17 127 lb (57.6 kg)    She was last seen for hypothyroid 1 years ago.  Management since that visit includes no change. She reports good compliance with treatment. She is not having side effects.   Symptoms: No change in energy level No constipation  No diarrhea No heat / cold intolerance  No nervousness No palpitations  No weight changes    ----------------------------------------------------------------------------------------- Lipid/Cholesterol, Follow-up  Last lipid panel Other pertinent labs  Lab Results  Component Value Date   CHOL 225 (H) 02/23/2017   HDL 57 02/23/2017   LDLCALC 130 (H) 02/23/2017   TRIG 234 (H) 02/23/2017   CHOLHDL 3.9 02/23/2017   Lab Results  Component Value Date   ALT 18 11/09/2017   AST 20 11/09/2017   PLT 265 11/09/2017   TSH 1.09 02/23/2017     She was last seen for this 1 years ago.  Management since that visit includes no change.  She reports good compliance with treatment.  She is not having side effects.   Symptoms: No chest pain No chest pressure/discomfort  No dyspnea No lower extremity edema  No numbness or tingling of extremity No orthopnea  No palpitations No paroxysmal nocturnal dyspnea  No speech difficulty No syncope   Current diet: in general, a "healthy" diet   Current exercise: walking  The ASCVD Risk score (Sophia Anderson., et al., 2013) failed to calculate for the following reasons:   The 2013 ASCVD risk score is only valid for ages 40 to  56  ---------------------------------------------------------------------------------------------------      Medications: Outpatient Medications Prior to Visit  Medication Sig  . albuterol (PROAIR HFA) 108 (90 Base) MCG/ACT inhaler Inhale 2 puffs into the lungs every 4 (four) hours as needed.  Marland Kitchen amLODipine (NORVASC) 2.5 MG tablet TAKE 1 TABLET BY MOUTH EVERY DAY  . ezetimibe (ZETIA) 10 MG tablet TAKE 1 TABLET BY MOUTH EVERYDAY AT BEDTIME  . gabapentin (NEURONTIN) 100 MG capsule Take 1 capsule (100 mg total) by mouth at bedtime.  Marland Kitchen levothyroxine (SYNTHROID) 88 MCG tablet TAKE 1 TABLET BY MOUTH EVERY DAY  . naproxen (NAPROSYN) 375 MG tablet TAKE 1 TABLET (375 MG TOTAL) BY MOUTH 2 (TWO) TIMES DAILY AS NEEDED FOR MILD PAIN. TAKE WITH MEALS  . omeprazole (PRILOSEC) 20 MG capsule   . triamcinolone (NASACORT ALLERGY 24HR) 55 MCG/ACT AERO nasal inhaler Place into the nose.   . Vitamin D, Ergocalciferol, (DRISDOL) 1.25 MG (50000 UNIT) CAPS capsule TAKE 1 CAPSULE BY MOUTH ONCE A WEEK  . propranolol ER (INDERAL LA) 60 MG 24 hr capsule TAKE 1 CAPSULE BY MOUTH EVERY DAY (Patient not taking: Reported on 09/09/2019)   No facility-administered medications prior to visit.    Review of Systems  Constitutional: Negative.   Respiratory: Negative.   Cardiovascular: Negative.   Musculoskeletal: Negative.       Objective    BP (!) 144/72   Pulse 92   Temp (!) 97.3 F (36.3 C) (Temporal)   Ht 5\' 1"  (1.549 m)   Wt 129 lb 12.8 oz (58.9 kg)   BMI 24.53 kg/m    Physical Exam   General: Appearance:    Well developed, well nourished female in no acute distress  Eyes:    PERRL, conjunctiva/corneas clear, EOM's intact       Lungs:     Clear to auscultation bilaterally, respirations unlabored  Heart:    Normal heart rate. Normal rhythm. No murmurs, rubs, or gallops.   MS:   All extremities are intact.   Neurologic:   Awake, alert, oriented x 3. No apparent focal neurological           defect.          Assessment & Plan     1. Essential (primary) hypertension BP elevated since stopping propranol due to memory changes. She is also borderline tachycardic. May do better with b-blocker with less CNS penetration.  - Comprehensive Metabolic Panel (CMET)  2. Hyperlipidemia, unspecified hyperlipidemia type Intolerant to statins, but doing well with ezetemibe - Lipid Profile - CBC  3. Vitamin D deficiency  - VITAMIN D 25 Hydroxy (Vit-D Deficiency, Fractures)  4. Adult hypothyroidism  - TSH  5. Estrogen deficiency  - DG Bone density [place]; Future  Follow up 1 month for blood pressure check  No follow-ups on file.         Sophia Huh, MD  [facility] [phone] (phone) [phone] (fax)  [place]

## 2019-09-10 ENCOUNTER — Telehealth: Payer: Self-pay

## 2019-09-10 LAB — CBC
Hematocrit: 42 % (ref 34.0–46.6)
Hemoglobin: 14.1 g/dL (ref 11.1–15.9)
MCH: 29.1 pg (ref 26.6–33.0)
MCHC: 33.6 g/dL (ref 31.5–35.7)
MCV: 87 fL (ref 79–97)
Platelets: 269 10*3/uL (ref 150–450)
RBC: 4.84 x10E6/uL (ref 3.77–5.28)
RDW: 13.1 % (ref 11.7–15.4)
WBC: 5.9 10*3/uL (ref 3.4–10.8)

## 2019-09-10 LAB — COMPREHENSIVE METABOLIC PANEL
ALT: 22 IU/L (ref 0–32)
AST: 23 IU/L (ref 0–40)
Albumin/Globulin Ratio: 1.4 (ref 1.2–2.2)
Albumin: 4.4 g/dL (ref 3.6–4.6)
Alkaline Phosphatase: 104 IU/L (ref 48–121)
BUN/Creatinine Ratio: 24 (ref 12–28)
BUN: 18 mg/dL (ref 8–27)
Bilirubin Total: 0.6 mg/dL (ref 0.0–1.2)
CO2: 23 mmol/L (ref 20–29)
Calcium: 9.6 mg/dL (ref 8.7–10.3)
Chloride: 100 mmol/L (ref 96–106)
Creatinine, Ser: 0.75 mg/dL (ref 0.57–1.00)
GFR calc Af Amer: 86 mL/min/{1.73_m2} (ref >59)
GFR calc non Af Amer: 75 mL/min/{1.73_m2} (ref >59)
Globulin, Total: 3.1 g/dL (ref 1.5–4.5)
Glucose: 111 mg/dL — ABNORMAL HIGH (ref 65–99)
Potassium: 4.5 mmol/L (ref 3.5–5.2)
Sodium: 137 mmol/L (ref 134–144)
Total Protein: 7.5 g/dL (ref 6.0–8.5)

## 2019-09-10 LAB — LIPID PANEL
Chol/HDL Ratio: 4.4 ratio (ref 0.0–4.4)
Cholesterol, Total: 261 mg/dL — ABNORMAL HIGH (ref 100–199)
HDL: 60 mg/dL (ref >39)
LDL Chol Calc (NIH): 169 mg/dL — ABNORMAL HIGH (ref 0–99)
Triglycerides: 178 mg/dL — ABNORMAL HIGH (ref 0–149)
VLDL Cholesterol Cal: 32 mg/dL (ref 5–40)

## 2019-09-10 LAB — TSH: TSH: 1.7 u[IU]/mL (ref 0.450–4.500)

## 2019-09-10 LAB — VITAMIN D 25 HYDROXY (VIT D DEFICIENCY, FRACTURES): Vit D, 25-Hydroxy: 44.5 ng/mL (ref 30.0–100.0)

## 2019-09-10 MED ORDER — AMLODIPINE BESYLATE 5 MG PO TABS
5.0000 mg | ORAL_TABLET | Freq: Every day | ORAL | 0 refills | Status: DC
Start: 2019-09-10 — End: 2019-10-23

## 2019-09-10 NOTE — Telephone Encounter (Signed)
-----   Message from Birdie Sons, MD sent at 09/10/2019  7:46 AM EDT ----- Cholesterol is up to 261, need to be more strict with diet and avoid saturated fats. Continue ezetimibe. Blood sugar is up slightly. Try cutting back on sweets and starches. Rest of labs are normal Need to increase amlodipine to 5mg  once daily to get BP back down and follow up in July as scheduled. Can send in prescription for #45 and no refills.

## 2019-09-10 NOTE — Telephone Encounter (Signed)
Patient advised. RX sent to pharmacy. Follow up already scheduled.

## 2019-10-14 ENCOUNTER — Encounter: Payer: Self-pay | Admitting: Family Medicine

## 2019-10-14 ENCOUNTER — Other Ambulatory Visit: Payer: Self-pay

## 2019-10-14 ENCOUNTER — Ambulatory Visit (INDEPENDENT_AMBULATORY_CARE_PROVIDER_SITE_OTHER): Payer: PPO | Admitting: Family Medicine

## 2019-10-14 VITALS — BP 130/58 | HR 80 | Temp 96.9°F | Wt 127.0 lb

## 2019-10-14 DIAGNOSIS — I10 Essential (primary) hypertension: Secondary | ICD-10-CM | POA: Diagnosis not present

## 2019-10-14 NOTE — Progress Notes (Signed)
Established patient visit   Patient: Sophia Anderson   DOB: [birth year]   84 y.o. Female  MRN: [id number] Visit Date: 10/14/2019  Today's healthcare provider: Lelon Huh, MD   Chief Complaint  Patient presents with  . Hypertension   Subjective    HPI  Hypertension, follow-up  BP Readings from Last 3 Encounters:  10/14/19 (!) 142/73  09/09/19 (!) 144/72  12/04/17 (!) 160/79   Wt Readings from Last 3 Encounters:  10/14/19 127 lb (57.6 kg)  09/09/19 129 lb 12.8 oz (58.9 kg)  12/04/17 129 lb (58.5 kg)     She was last seen for hypertension 1 months ago.  BP at that visit was 144/72. Management since that visit includes increasing Amlodipine to 5mg  a day.  She reports excellent compliance with treatment. She is not having side effects.  She is following a Low Sodium diet. She is exercising. She does not smoke.  Use of agents associated with hypertension: none.   Outside blood pressures are 130-140's/70's. Symptoms: No chest pain No chest pressure  No palpitations No syncope  No dyspnea No orthopnea  No paroxysmal nocturnal dyspnea No lower extremity edema   Pertinent labs: Lab Results  Component Value Date   CHOL 261 (H) 09/09/2019   HDL 60 09/09/2019   LDLCALC 169 (H) 09/09/2019   TRIG 178 (H) 09/09/2019   CHOLHDL 4.4 09/09/2019   Lab Results  Component Value Date   NA 137 09/09/2019   K 4.5 09/09/2019   CREATININE 0.75 09/09/2019   GFRNONAA 75 09/09/2019   GFRAA 86 09/09/2019   GLUCOSE 111 (H) 09/09/2019     The ASCVD Risk score (Goff DC Jr., et al., 2013) failed to calculate for the following reasons:   The 2013 ASCVD risk score is only valid for ages 67 to 49   ---------------------------------------------------------------------------------------------------      Medications: Outpatient Medications Prior to Visit  Medication Sig  . albuterol (PROAIR HFA) 108 (90 Base) MCG/ACT inhaler Inhale 2 puffs into the lungs every 4 (four) hours as  needed.  Marland Kitchen amLODipine (NORVASC) 5 MG tablet Take 1 tablet (5 mg total) by mouth daily.  Marland Kitchen ezetimibe (ZETIA) 10 MG tablet TAKE 1 TABLET BY MOUTH EVERYDAY AT BEDTIME  . gabapentin (NEURONTIN) 100 MG capsule Take 1 capsule (100 mg total) by mouth at bedtime.  Marland Kitchen levothyroxine (SYNTHROID) 88 MCG tablet TAKE 1 TABLET BY MOUTH EVERY DAY  . naproxen (NAPROSYN) 375 MG tablet TAKE 1 TABLET (375 MG TOTAL) BY MOUTH 2 (TWO) TIMES DAILY AS NEEDED FOR MILD PAIN. TAKE WITH MEALS  . omeprazole (PRILOSEC) 20 MG capsule   . triamcinolone (NASACORT ALLERGY 24HR) 55 MCG/ACT AERO nasal inhaler Place into the nose.   . Vitamin D, Ergocalciferol, (DRISDOL) 1.25 MG (50000 UNIT) CAPS capsule TAKE 1 CAPSULE BY MOUTH ONCE A WEEK   No facility-administered medications prior to visit.    Review of Systems  Constitutional: Negative.   Respiratory: Negative.   Cardiovascular: Negative.  Negative for chest pain, palpitations and leg swelling.  Gastrointestinal: Negative.   Musculoskeletal:       Pt reports both her feet hurt.   Neurological: Negative for dizziness, light-headedness and headaches.      Objective    BP (!) 130/58   Pulse 80   Temp (!) 96.9 F (36.1 C) (Temporal)   Wt 127 lb (57.6 kg)   BMI 24.00 kg/m    Physical Exam  General appearance: Well developed, well nourished female, cooperative  and in no acute distress Head: Normocephalic, without obvious abnormality, atraumatic Respiratory: Respirations even and unlabored, normal respiratory rate Extremities: All extremities are intact.  Skin: Skin color, texture, turgor normal. No rashes seen  Psych: Appropriate mood and affect. Neurologic: Mental status: Alert, oriented to person, place, and time, thought content appropriate.   No results found for any visits on 10/14/19.  Assessment & Plan     1. Essential (primary) hypertension Much better with increase dose of amlodipine.  Continue current medications.          The entirety of the  information documented in the History of Present Illness, Review of Systems and Physical Exam were personally obtained by me. Portions of this information were initially documented by the CMA and reviewed by me for thoroughness and accuracy.      Lelon Huh, MD  [facility] [phone] (phone) [phone] (fax)  [place]

## 2019-10-23 ENCOUNTER — Other Ambulatory Visit: Payer: Self-pay | Admitting: Family Medicine

## 2019-10-29 ENCOUNTER — Other Ambulatory Visit: Payer: Self-pay | Admitting: Family Medicine

## 2019-10-29 NOTE — Telephone Encounter (Signed)
Requested medications are due for refill today?  Yes - 50,000 IU strengths cannot be delegated.    Requested medications are on active medication list?  Yes  Last Refill:   08/06/2019  # 12 with no refills  Future visit scheduled? No  Notes to Clinic:  50,000 IU strengths cannot be delegated.

## 2019-11-10 ENCOUNTER — Other Ambulatory Visit: Payer: Self-pay | Admitting: Family Medicine

## 2019-11-10 DIAGNOSIS — I10 Essential (primary) hypertension: Secondary | ICD-10-CM

## 2019-12-03 ENCOUNTER — Other Ambulatory Visit: Payer: Self-pay | Admitting: Family Medicine

## 2019-12-11 ENCOUNTER — Other Ambulatory Visit: Payer: Self-pay | Admitting: Family Medicine

## 2020-01-18 ENCOUNTER — Other Ambulatory Visit: Payer: Self-pay | Admitting: Family Medicine

## 2020-01-18 DIAGNOSIS — E559 Vitamin D deficiency, unspecified: Secondary | ICD-10-CM

## 2020-01-18 NOTE — Telephone Encounter (Signed)
Requested medications are due for refill today yes  Requested medications are on the active medication list yes  Last refill 8/11  Last visit 08/2019  Future visit scheduled 08/2020  Notes to clinic Not Delegated.

## 2020-02-28 ENCOUNTER — Other Ambulatory Visit: Payer: Self-pay | Admitting: Family Medicine

## 2020-05-21 DIAGNOSIS — S299XXA Unspecified injury of thorax, initial encounter: Secondary | ICD-10-CM | POA: Diagnosis not present

## 2020-05-21 DIAGNOSIS — J9811 Atelectasis: Secondary | ICD-10-CM | POA: Diagnosis not present

## 2020-05-21 DIAGNOSIS — S20219A Contusion of unspecified front wall of thorax, initial encounter: Secondary | ICD-10-CM | POA: Diagnosis not present

## 2020-07-06 ENCOUNTER — Other Ambulatory Visit: Payer: Self-pay | Admitting: Family Medicine

## 2020-08-29 ENCOUNTER — Other Ambulatory Visit: Payer: Self-pay | Admitting: Family Medicine

## 2020-08-29 NOTE — Telephone Encounter (Signed)
Pt has upcoming appt 09/08/20- blood work is due and pt has appt on the same day.  Requested Prescriptions  Pending Prescriptions Disp Refills  . ezetimibe (ZETIA) 10 MG tablet [Pharmacy Med Name: EZETIMIBE 10 MG TABLET] 10 tablet 0    Sig: TAKE 1 TABLET BY MOUTH EVERYDAY AT BEDTIME     Cardiovascular:  Antilipid - Sterol Transport Inhibitors Failed - 08/29/2020 12:48 AM      Failed - Total Cholesterol in normal range and within 360 days    Cholesterol, Total  Date Value Ref Range Status  09/09/2019 261 (H) 100 - 199 mg/dL Final         Failed - LDL in normal range and within 360 days    LDL Cholesterol (Calc)  Date Value Ref Range Status  02/23/2017 130 (H) mg/dL (calc) Final    Comment:    Reference range: <100 . Desirable range <100 mg/dL for primary prevention;   <70 mg/dL for patients with CHD or diabetic patients  with > or = 2 CHD risk factors. Marland Kitchen LDL-C is now calculated using the Martin-Hopkins  calculation, which is a validated novel method providing  better accuracy than the Friedewald equation in the  estimation of LDL-C.  Cresenciano Genre et al. Annamaria Helling. 8325;498(26): 2061-2068  (http://education.[employer].com/faq/FAQ164)    LDL Chol Calc (NIH)  Date Value Ref Range Status  09/09/2019 169 (H) 0 - 99 mg/dL Final         Failed - Triglycerides in normal range and within 360 days    Triglycerides  Date Value Ref Range Status  09/09/2019 178 (H) 0 - 149 mg/dL Final         Passed - HDL in normal range and within 360 days    HDL  Date Value Ref Range Status  09/09/2019 60 >39 mg/dL Final         Passed - Valid encounter within last 12 months    Recent Outpatient Visits          10 months ago Essential (primary) hypertension   [facility] Birdie Sons, MD   11 months ago Essential (primary) hypertension   [facility] Birdie Sons, MD   2 years ago Helicobacter pylori gastritis   [facility] Birdie Sons, MD   2 years ago Generalized abdominal pain   [facility] Birdie Sons, MD   3 years ago Annual physical exam   [facility] Birdie Sons, MD

## 2020-09-05 ENCOUNTER — Other Ambulatory Visit: Payer: Self-pay | Admitting: Family Medicine

## 2020-09-05 NOTE — Telephone Encounter (Signed)
Requested Prescriptions  Pending Prescriptions Disp Refills  . levothyroxine (SYNTHROID) 88 MCG tablet [Pharmacy Med Name: LEVOTHYROXINE 88 MCG TABLET] 30 tablet 0    Sig: TAKE 1 TABLET BY MOUTH EVERY DAY     Endocrinology:  Hypothyroid Agents Failed - 09/05/2020 12:51 AM      Failed - TSH needs to be rechecked within 3 months after an abnormal result. Refill until TSH is due.      Failed - TSH in normal range and within 360 days    TSH  Date Value Ref Range Status  09/09/2019 1.700 0.450 - 4.500 uIU/mL Final         Passed - Valid encounter within last 12 months    Recent Outpatient Visits          10 months ago Essential (primary) hypertension   [facility] Birdie Sons, MD   12 months ago Essential (primary) hypertension   [facility] Birdie Sons, MD   2 years ago Helicobacter pylori gastritis   [facility] Birdie Sons, MD   2 years ago Generalized abdominal pain   [facility] Birdie Sons, MD   3 years ago Annual physical exam   [facility] Birdie Sons, MD

## 2020-09-08 ENCOUNTER — Encounter: Payer: Self-pay | Admitting: Family Medicine

## 2020-09-08 ENCOUNTER — Ambulatory Visit (INDEPENDENT_AMBULATORY_CARE_PROVIDER_SITE_OTHER): Payer: PPO | Admitting: Family Medicine

## 2020-09-08 ENCOUNTER — Other Ambulatory Visit: Payer: Self-pay

## 2020-09-08 VITALS — BP 146/62 | HR 83 | Ht 62.0 in | Wt 127.0 lb

## 2020-09-08 DIAGNOSIS — I10 Essential (primary) hypertension: Secondary | ICD-10-CM

## 2020-09-08 DIAGNOSIS — E785 Hyperlipidemia, unspecified: Secondary | ICD-10-CM

## 2020-09-08 DIAGNOSIS — E559 Vitamin D deficiency, unspecified: Secondary | ICD-10-CM

## 2020-09-08 DIAGNOSIS — E039 Hypothyroidism, unspecified: Secondary | ICD-10-CM | POA: Diagnosis not present

## 2020-09-08 DIAGNOSIS — Z0000 Encounter for general adult medical examination without abnormal findings: Secondary | ICD-10-CM

## 2020-09-08 DIAGNOSIS — F5101 Primary insomnia: Secondary | ICD-10-CM

## 2020-09-08 DIAGNOSIS — E2839 Other primary ovarian failure: Secondary | ICD-10-CM

## 2020-09-08 MED ORDER — TRAZODONE HCL 100 MG PO TABS: 50.0000 mg | ORAL_TABLET | Freq: Every day | ORAL | 5 refills | Status: DC

## 2020-09-08 NOTE — Patient Instructions (Addendum)
I strongly recommend at least one booster dose (a third shot) of the Covid vaccine for all adults. People at high risk for serious Covid infections should have a second booster dose 4-6 months after the first booster.     Insomnia Insomnia is a sleep disorder that makes it difficult to fall asleep or stay asleep. Insomnia can cause fatigue, low energy, difficulty concentrating, moodswings, and poor performance at work or school. There are three different ways to classify insomnia: Difficulty falling asleep. Difficulty staying asleep. Waking up too early in the morning. Any type of insomnia can be long-term (chronic) or short-term (acute). Both are common. Short-term insomnia usually lasts for three months or less. Chronic insomnia occurs at least three times a week for longer than threemonths. What are the causes? Insomnia may be caused by another condition, situation, or substance, such as: Anxiety. Certain medicines. Gastroesophageal reflux disease (GERD) or other gastrointestinal conditions. Asthma or other breathing conditions. Restless legs syndrome, sleep apnea, or other sleep disorders. Chronic pain. Menopause. Stroke. Abuse of alcohol, tobacco, or illegal drugs. Mental health conditions, such as depression. Caffeine. Neurological disorders, such as Alzheimer's disease. An overactive thyroid (hyperthyroidism). Sometimes, the cause of insomnia may not be known. What increases the risk? Risk factors for insomnia include: Gender. Women are affected more often than men. Age. Insomnia is more common as you get older. Stress. Lack of exercise. Irregular work schedule or working night shifts. Traveling between different time zones. Certain medical and mental health conditions. What are the signs or symptoms? If you have insomnia, the main symptom is having trouble falling asleep or having trouble staying asleep. This may lead to other symptoms, such as: Feeling fatigued or having  low energy. Feeling nervous about going to sleep. Not feeling rested in the morning. Having trouble concentrating. Feeling irritable, anxious, or depressed. How is this diagnosed? This condition may be diagnosed based on: Your symptoms and medical history. Your health care provider may ask about: Your sleep habits. Any medical conditions you have. Your mental health. A physical exam. How is this treated? Treatment for insomnia depends on the cause. Treatment may focus on treating an underlying condition that is causing insomnia. Treatment may also include: Medicines to help you sleep. Counseling or therapy. Lifestyle adjustments to help you sleep better. Follow these instructions at home: Eating and drinking  Limit or avoid alcohol, caffeinated beverages, and cigarettes, especially close to bedtime. These can disrupt your sleep. Do not eat a large meal or eat spicy foods right before bedtime. This can lead to digestive discomfort that can make it hard for you to sleep.  Sleep habits  Keep a sleep diary to help you and your health care provider figure out what could be causing your insomnia. Write down: When you sleep. When you wake up during the night. How well you sleep. How rested you feel the next day. Any side effects of medicines you are taking. What you eat and drink. Make your bedroom a dark, comfortable place where it is easy to fall asleep. Put up shades or blackout curtains to block light from outside. Use a white noise machine to block noise. Keep the temperature cool. Limit screen use before bedtime. This includes: Watching TV. Using your smartphone, tablet, or computer. Stick to a routine that includes going to bed and waking up at the same times every day and night. This can help you fall asleep faster. Consider making a quiet activity, such as reading, part of your nighttime routine.  Try to avoid taking naps during the day so that you sleep better at night. Get  out of bed if you are still awake after 15 minutes of trying to sleep. Keep the lights down, but try reading or doing a quiet activity. When you feel sleepy, go back to bed.  General instructions Take over-the-counter and prescription medicines only as told by your health care provider. Exercise regularly, as told by your health care provider. Avoid exercise starting several hours before bedtime. Use relaxation techniques to manage stress. Ask your health care provider to suggest some techniques that may work well for you. These may include: Breathing exercises. Routines to release muscle tension. Visualizing peaceful scenes. Make sure that you drive carefully. Avoid driving if you feel very sleepy. Keep all follow-up visits as told by your health care provider. This is important. Contact a health care provider if: You are tired throughout the day. You have trouble in your daily routine due to sleepiness. You continue to have sleep problems, or your sleep problems get worse. Get help right away if: You have serious thoughts about hurting yourself or someone else. If you ever feel like you may hurt yourself or others, or have thoughts about taking your own life, get help right away. You can go to your nearest emergency department or call: Your local emergency services (911 in the [place] A suicide crisis helpline, such as the [place] at 3255686142. This is open 24 hours a day. Summary Insomnia is a sleep disorder that makes it difficult to fall asleep or stay asleep. Insomnia can be long-term (chronic) or short-term (acute). Treatment for insomnia depends on the cause. Treatment may focus on treating an underlying condition that is causing insomnia. Keep a sleep diary to help you and your health care provider figure out what could be causing your insomnia. This information is not intended to replace advice given to you by your health care provider. Make sure  you discuss any questions you have with your healthcare provider. Document Revised: 01/15/2020 Document Reviewed: 01/15/2020 Elsevier Patient Education  2022 [employer].

## 2020-09-08 NOTE — Progress Notes (Signed)
Annual Wellness Visit     Patient: Sophia Anderson, Female    DOB: Sep 19, 1935, 85 y.o.   MRN: [id number] Visit Date: 09/08/2020  Today's Provider: Lelon Huh, MD    Subjective    Sophia Anderson is a 85 y.o. female who presents today for her Annual Wellness Visit.     Medications: Outpatient Medications Prior to Visit  Medication Sig   amLODipine (NORVASC) 5 MG tablet TAKE 1 TABLET BY MOUTH EVERY DAY   ezetimibe (ZETIA) 10 MG tablet TAKE 1 TABLET BY MOUTH EVERYDAY AT BEDTIME   levothyroxine (SYNTHROID) 88 MCG tablet TAKE 1 TABLET BY MOUTH EVERY DAY   naproxen (NAPROSYN) 375 MG tablet TAKE 1 TABLET (375 MG TOTAL) BY MOUTH 2 (TWO) TIMES DAILY AS NEEDED FOR MILD PAIN. TAKE WITH MEALS   triamcinolone (NASACORT) 55 MCG/ACT AERO nasal inhaler Place into the nose.    Vitamin D, Ergocalciferol, (DRISDOL) 1.25 MG (50000 UNIT) CAPS capsule TAKE 1 CAPSULE BY MOUTH ONE TIME PER WEEK   [DISCONTINUED] albuterol (PROAIR HFA) 108 (90 Base) MCG/ACT inhaler Inhale 2 puffs into the lungs every 4 (four) hours as needed. (Patient not taking: Reported on 09/08/2020)   [DISCONTINUED] gabapentin (NEURONTIN) 100 MG capsule Take 1 capsule (100 mg total) by mouth at bedtime. (Patient not taking: Reported on 09/08/2020)   [DISCONTINUED] omeprazole (PRILOSEC) 20 MG capsule  (Patient not taking: Reported on 09/08/2020)   No facility-administered medications prior to visit.    Allergies  Allergen Reactions   Fluvastatin Sodium     Heartburn   Penicillins    Welchol  [Colesevelam Hcl]     Heartburn   Propranolol     Trouble with memory    Patient Care Team: Birdie Sons, MD as PCP - General (Family Medicine) [place], Romilda Garret, DPM as Consulting Physician (Podiatry)        Objective    Vitals: BP (!) 146/62 (BP Location: Right Arm, Patient Position: Sitting, Cuff Size: Normal)   Pulse 83   Ht 5\' 2"  (1.575 m)   Wt 127 lb (57.6 kg)   SpO2 100%   BMI 23.23 kg/m     Most recent functional  status assessment: In your present state of health, do you have any difficulty performing the following activities: 09/08/2020  Hearing? N  Vision? Y  Difficulty concentrating or making decisions? N  Walking or climbing stairs? N  Dressing or bathing? N  Doing errands, shopping? N  Some recent data might be hidden   Most recent fall risk assessment: Fall Risk  09/08/2020  Falls in the past year? 0  Comment -  Number falls in past yr: 0  Comment -  Injury with Fall? 0  Risk for fall due to : No Fall Risks  Follow up Falls evaluation completed    Most recent depression screenings: PHQ 2/9 Scores 09/08/2020 09/09/2019  PHQ - 2 Score 0 0  PHQ- 9 Score 3 -   Most recent cognitive screening: 6CIT Screen 09/08/2020  What Year? 0 points  What month? 0 points  What time? 0 points  Count back from 20 0 points  Months in reverse 2 points  Repeat phrase 2 points  Total Score 4   Most recent Audit-C alcohol use screening Alcohol Use Disorder Test (AUDIT) 09/08/2020  1. How often do you have a drink containing alcohol? 1  2. How many drinks containing alcohol do you have on a typical day when you are drinking? 0  3. How often do you have six or more drinks on one occasion? 0  AUDIT-C Score 1  Alcohol Brief Interventions/Follow-up -   A score of 3 or more in women, and 4 or more in men indicates increased risk for alcohol abuse, EXCEPT if all of the points are from question 1   No results found for any visits on 09/08/20.  Assessment & Plan     Annual wellness visit done today including the all of the following: Reviewed patient's Family Medical History Reviewed and updated list of patient's medical providers Assessment of cognitive impairment was done Assessed patient's functional ability Established a written schedule for health screening [place] Completed and Reviewed  Exercise Activities and Dietary recommendations  Goals      DIET - INCREASE WATER  INTAKE     Recommend increasing water intake to 4 glasses a day.        Increase water intake     Starting 03/15/16, I will increase my water intake to 3 glasses a day.          Immunization History  Administered Date(s) Administered   Influenza, High Dose Seasonal PF 03/15/2016, 02/12/2017, 12/04/2017, 01/23/2020   Influenza,inj,Quad PF,6+ Mos 12/20/2018, 01/28/2019   Influenza-Unspecified 12/20/2018   PFIZER(Purple Top)SARS-COV-2 Vaccination 04/07/2019, 04/28/2019   Pneumococcal Conjugate-13 09/12/2013   Pneumococcal Polysaccharide-23 02/27/2011    Health Maintenance  Topic Date Due   TETANUS/TDAP  Never done   Zoster Vaccines- Shingrix (1 of 2) Never done   DEXA SCAN  04/14/2010   COVID-19 Vaccine (3 - Booster for Pfizer series) 09/25/2019   INFLUENZA VACCINE  10/18/2020   PNA vac Low Risk Adult  Completed   HPV VACCINES  Aged Out     Discussed health benefits of physical activity, and encouraged her to engage in regular exercise appropriate for her age and condition.           The entirety of the information documented in the History of Present Illness, Review of Systems and Physical Exam were personally obtained by me. Portions of this information were initially documented by the CMA and reviewed by me for thoroughness and accuracy.     Lelon Huh, MD  [facility] [phone] (phone) [phone] (fax)  [place]

## 2020-09-08 NOTE — Progress Notes (Signed)
Complete Physical     Patient: Sophia Anderson, Female    DOB: Jul 25, 1935, 85 y.o.   MRN: [id number] Visit Date: 09/08/2020  Today's Provider: Lelon Huh, MD   Chief Complaint  Patient presents with   Annual Exam   Subjective    [name] is a 85 y.o. female who presents today for her Annual Physical She reports consuming a general diet.  Exercises regularly.  She generally feels well. She reports sleeping fairly well. She does not have additional problems to discuss today.   HPI Her only complaint today is that she is unable to sleep through the night. She usually wakes up after 3-4 hours and can't go back to sleep unless she takes a sleeping pill.     Medications: Outpatient Medications Prior to Visit  Medication Sig   amLODipine (NORVASC) 5 MG tablet TAKE 1 TABLET BY MOUTH EVERY DAY   ezetimibe (ZETIA) 10 MG tablet TAKE 1 TABLET BY MOUTH EVERYDAY AT BEDTIME   levothyroxine (SYNTHROID) 88 MCG tablet TAKE 1 TABLET BY MOUTH EVERY DAY   naproxen (NAPROSYN) 375 MG tablet TAKE 1 TABLET (375 MG TOTAL) BY MOUTH 2 (TWO) TIMES DAILY AS NEEDED FOR MILD PAIN. TAKE WITH MEALS   triamcinolone (NASACORT) 55 MCG/ACT AERO nasal inhaler Place into the nose.    Vitamin D, Ergocalciferol, (DRISDOL) 1.25 MG (50000 UNIT) CAPS capsule TAKE 1 CAPSULE BY MOUTH ONE TIME PER WEEK   albuterol (PROAIR HFA) 108 (90 Base) MCG/ACT inhaler Inhale 2 puffs into the lungs every 4 (four) hours as needed. (Patient not taking: Reported on 09/08/2020)   gabapentin (NEURONTIN) 100 MG capsule Take 1 capsule (100 mg total) by mouth at bedtime. (Patient not taking: Reported on 09/08/2020)   omeprazole (PRILOSEC) 20 MG capsule  (Patient not taking: Reported on 09/08/2020)   No facility-administered medications prior to visit.    Allergies  Allergen Reactions   Fluvastatin Sodium     Heartburn   Penicillins    Welchol  [Colesevelam Hcl]     Heartburn   Propranolol     Trouble with memory    Patient Care  Team: Birdie Sons, MD as PCP - General (Family Medicine) Garrel Ridgel, DPM as Consulting Physician (Podiatry)  Review of Systems  Constitutional: Negative.   HENT: Negative.    Eyes: Negative.   Respiratory: Negative.    Cardiovascular: Negative.   Gastrointestinal: Negative.   Endocrine: Negative.   Genitourinary: Negative.   Musculoskeletal: Negative.   Skin: Negative.   Allergic/Immunologic: Negative.   Neurological: Negative.   Hematological: Negative.   Psychiatric/Behavioral: Negative.         Objective    Vitals: BP (!) 146/62 (BP Location: Right Arm, Patient Position: Sitting, Cuff Size: Normal)   Pulse 83   Ht 5\' 2"  (1.575 m)   Wt 127 lb (57.6 kg)   SpO2 100%   BMI 23.23 kg/m    Physical Exam  General Appearance:    Well developed, well nourished female. Alert, cooperative, in no acute distress, appears stated age   Head:    Normocephalic, without obvious abnormality, atraumatic  Eyes:    PERRL, conjunctiva/corneas clear, EOM's intact, fundi    benign, both eyes  Ears:    Normal TM's and external ear canals, both ears  Neck:   Supple, symmetrical, trachea midline, no adenopathy;    thyroid:  no enlargement/tenderness/nodules; no carotid   bruit or JVD  Back:     Symmetric, no  curvature, ROM normal, no CVA tenderness  Lungs:     Clear to auscultation bilaterally, respirations unlabored  Chest Wall:    No tenderness or deformity   Heart:    Normal heart rate. Normal rhythm. No murmurs, rubs, or gallops.   Breast Exam:    deferred  Abdomen:     Soft, non-tender, bowel sounds active all four quadrants,    no masses, no organomegaly  Pelvic:    deferred  Extremities:   All extremities are intact. No cyanosis or edema  Pulses:   2+ and symmetric all extremities  Skin:   Skin color, texture, turgor normal, no rashes or lesions  Lymph nodes:   Cervical, supraclavicular, and axillary nodes normal  Neurologic:   CNII-XII intact, normal strength, sensation  and reflexes    throughout      Assessment & Plan     1. Annual physical exam   2. Vitamin D deficiency  - VITAMIN D 25 Hydroxy (Vit-D Deficiency, Fractures)  3. Hyperlipidemia, unspecified hyperlipidemia type Doing well with ezetimibe.   - CBC - Comprehensive metabolic panel - Lipid panel  4. Adult hypothyroidism  - TSH  5. Essential (primary) hypertension Doing well with current medications. Reports home blood pressures are much better than today's office reading.   6. Estrogen deficiency  - DG Bone density [place]; Future  7. Primary insomnia  - traZODone (DESYREL) 100 MG tablet; Take 0.5-1 tablets (50-100 mg total) by mouth at bedtime.  Dispense: 30 tablet; Refill: 5       The entirety of the information documented in the History of Present Illness, Review of Systems and Physical Exam were personally obtained by me. Portions of this information were initially documented by the CMA and reviewed by me for thoroughness and accuracy.     Lelon Huh, MD  [facility] [phone] (phone) [phone] (fax)  [place]

## 2020-09-09 LAB — COMPREHENSIVE METABOLIC PANEL
ALT: 15 IU/L (ref 0–32)
AST: 21 IU/L (ref 0–40)
Albumin/Globulin Ratio: 1.8 (ref 1.2–2.2)
Albumin: 4.5 g/dL (ref 3.6–4.6)
Alkaline Phosphatase: 107 IU/L (ref 44–121)
BUN/Creatinine Ratio: 27 (ref 12–28)
BUN: 19 mg/dL (ref 8–27)
Bilirubin Total: 0.5 mg/dL (ref 0.0–1.2)
CO2: 23 mmol/L (ref 20–29)
Calcium: 9.4 mg/dL (ref 8.7–10.3)
Chloride: 102 mmol/L (ref 96–106)
Creatinine, Ser: 0.7 mg/dL (ref 0.57–1.00)
Globulin, Total: 2.5 g/dL (ref 1.5–4.5)
Glucose: 120 mg/dL — ABNORMAL HIGH (ref 65–99)
Potassium: 4.6 mmol/L (ref 3.5–5.2)
Sodium: 139 mmol/L (ref 134–144)
Total Protein: 7 g/dL (ref 6.0–8.5)
eGFR: 86 mL/min/{1.73_m2} (ref >59)

## 2020-09-09 LAB — LIPID PANEL
Chol/HDL Ratio: 3.5 ratio (ref 0.0–4.4)
Cholesterol, Total: 242 mg/dL — ABNORMAL HIGH (ref 100–199)
HDL: 69 mg/dL (ref >39)
LDL Chol Calc (NIH): 149 mg/dL — ABNORMAL HIGH (ref 0–99)
Triglycerides: 135 mg/dL (ref 0–149)
VLDL Cholesterol Cal: 24 mg/dL (ref 5–40)

## 2020-09-09 LAB — VITAMIN D 25 HYDROXY (VIT D DEFICIENCY, FRACTURES): Vit D, 25-Hydroxy: 58.8 ng/mL (ref 30.0–100.0)

## 2020-09-09 LAB — CBC
Hematocrit: 41.2 % (ref 34.0–46.6)
Hemoglobin: 13.7 g/dL (ref 11.1–15.9)
MCH: 28.3 pg (ref 26.6–33.0)
MCHC: 33.3 g/dL (ref 31.5–35.7)
MCV: 85 fL (ref 79–97)
Platelets: 272 10*3/uL (ref 150–450)
RBC: 4.84 x10E6/uL (ref 3.77–5.28)
RDW: 13 % (ref 11.7–15.4)
WBC: 6 10*3/uL (ref 3.4–10.8)

## 2020-09-09 LAB — TSH: TSH: 0.545 u[IU]/mL (ref 0.450–4.500)

## 2020-09-28 ENCOUNTER — Other Ambulatory Visit: Payer: Self-pay

## 2020-09-28 ENCOUNTER — Ambulatory Visit
Admission: RE | Admit: 2020-09-28 | Discharge: 2020-09-28 | Disposition: A | Discharge status: Home / Self Care | Payer: PPO | Source: Ambulatory Visit | Attending: Family Medicine | Admitting: Family Medicine

## 2020-09-28 DIAGNOSIS — E2839 Other primary ovarian failure: Secondary | ICD-10-CM

## 2020-09-28 DIAGNOSIS — M810 Age-related osteoporosis without current pathological fracture: Secondary | ICD-10-CM | POA: Diagnosis not present

## 2020-09-28 DIAGNOSIS — M85851 Other specified disorders of bone density and structure, right thigh: Secondary | ICD-10-CM | POA: Diagnosis not present

## 2020-09-28 DIAGNOSIS — Z780 Asymptomatic menopausal state: Secondary | ICD-10-CM | POA: Diagnosis not present

## 2020-09-30 ENCOUNTER — Encounter: Payer: Self-pay | Admitting: Family Medicine

## 2020-09-30 ENCOUNTER — Other Ambulatory Visit: Payer: Self-pay | Admitting: Family Medicine

## 2020-09-30 NOTE — Telephone Encounter (Signed)
Requested medication (s) are due for refill today -no  Requested medication (s) are on the active medication list -yes  Future visit scheduled -yes  Last refill: 09/08/20  Notes to clinic: Pharmacy request changes to original Rx( 90 day supply)- patient was seen 09/08/20 and was given #30 5RF- sent for review  of request- states failed protocol- but has been seen   Requested Prescriptions  Pending Prescriptions Disp Refills   traZODone (DESYREL) 100 MG tablet [Pharmacy Med Name: TRAZODONE 100 MG TABLET] 90 tablet 2    Sig: Take 0.5-1 tablets (50-100 mg total) by mouth at bedtime.      Psychiatry: Antidepressants - Serotonin Modulator Failed - 09/30/2020  9:34 AM      Failed - Valid encounter within last 6 months    Recent Outpatient Visits           3 weeks ago Annual physical exam   [facility] Birdie Sons, MD   11 months ago Essential (primary) hypertension   [facility], Kirstie Peri, MD   1 year ago Essential (primary) hypertension   [facility] Birdie Sons, MD   2 years ago Helicobacter pylori gastritis   [facility] Birdie Sons, MD   2 years ago Generalized abdominal pain   [place], MD       Future Appointments             In 5 months [place], Kirstie Peri, MD [facility]                 Requested Prescriptions  Pending Prescriptions Disp Refills   traZODone (DESYREL) 100 MG tablet [Pharmacy Med Name: TRAZODONE 100 MG TABLET] 90 tablet 2    Sig: Take 0.5-1 tablets (50-100 mg total) by mouth at bedtime.      Psychiatry: Antidepressants - Serotonin Modulator Failed - 09/30/2020  9:34 AM      Failed - Valid encounter within last 6 months    Recent Outpatient Visits           3 weeks ago Annual physical exam   [facility] Birdie Sons, MD   11 months ago Essential (primary) hypertension   [facility] Birdie Sons, MD   1 year ago Essential (primary) hypertension   [facility] Birdie Sons, MD   2 years ago Helicobacter pylori gastritis   [facility] Birdie Sons, MD   2 years ago Generalized abdominal pain   [facility] Birdie Sons, MD       Future Appointments             In 5 months Fisher, Kirstie Peri, MD [facility], [place]

## 2020-10-01 ENCOUNTER — Other Ambulatory Visit: Payer: Self-pay | Admitting: Family Medicine

## 2020-10-02 ENCOUNTER — Other Ambulatory Visit: Payer: Self-pay | Admitting: Family Medicine

## 2020-10-02 DIAGNOSIS — E559 Vitamin D deficiency, unspecified: Secondary | ICD-10-CM

## 2020-10-02 NOTE — Telephone Encounter (Signed)
Requested medication (s) are due for refill today: yes  Requested medication (s) are on the active medication list: yes  Last refill:  01/20/20  Future visit scheduled: yes  Notes to clinic:  med not delegated to NT to RF   Requested Prescriptions  Pending Prescriptions Disp Refills   Vitamin D, Ergocalciferol, (DRISDOL) 1.25 MG (50000 UNIT) CAPS capsule [Pharmacy Med Name: VITAMIN D2 1.25MG (50,000 UNIT)] 12 capsule 2    Sig: TAKE 1 CAPSULE BY MOUTH ONE TIME PER WEEK      Endocrinology:  Vitamins - Vitamin D Supplementation Failed - 10/02/2020 12:40 PM      Failed - 50,000 IU strengths are not delegated      Failed - Phosphate in normal range and within 360 days    No results found for: PHOS        Passed - Ca in normal range and within 360 days    Calcium  Date Value Ref Range Status  09/08/2020 9.4 8.7 - 10.3 mg/dL Final          Passed - Vitamin D in normal range and within 360 days    Vit D, 25-Hydroxy  Date Value Ref Range Status  09/08/2020 58.8 30.0 - 100.0 ng/mL Final    Comment:    Vitamin D deficiency has been defined by the Institute of Medicine and an Endocrine Society practice guideline as a level of serum 25-OH vitamin D less than 20 ng/mL (1,2). The Endocrine Society went on to further define vitamin D insufficiency as a level between 21 and 29 ng/mL (2). 1. IOM (Institute of Medicine). 2010. Dietary reference    intakes for calcium and D. [place]: The    [employer]. 2. Holick MF, Binkley [place], Bischoff-Ferrari HA, et al.    Evaluation, treatment, and prevention of vitamin D    deficiency: an Endocrine Society clinical practice    guideline. JCEM. 2011 Jul; 96(7):1911-30.           Passed - Valid encounter within last 12 months    Recent Outpatient Visits           3 weeks ago Annual physical exam   [facility] Birdie Sons, MD   11 months ago Essential (primary) hypertension   [facility]  Birdie Sons, MD   1 year ago Essential (primary) hypertension   [facility] Birdie Sons, MD   2 years ago Helicobacter pylori gastritis   [facility] Birdie Sons, MD   2 years ago Generalized abdominal pain   [facility] Birdie Sons, MD       Future Appointments             In 5 months Fisher, Kirstie Peri, MD [facility], [place]

## 2020-10-04 ENCOUNTER — Other Ambulatory Visit: Payer: Self-pay | Admitting: Family Medicine

## 2020-10-06 ENCOUNTER — Telehealth: Payer: Self-pay

## 2020-10-06 DIAGNOSIS — M810 Age-related osteoporosis without current pathological fracture: Secondary | ICD-10-CM

## 2020-10-06 MED ORDER — ALENDRONATE SODIUM 70 MG PO TABS: 70.0000 mg | ORAL_TABLET | ORAL | 4 refills | Status: DC

## 2020-10-06 NOTE — Telephone Encounter (Signed)
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 Need to start alendronate 70mg  once every week, #4, rf  x 4. Continue vitamin d supplement. Call if any trouble filling or taking medications. Repeat BMD in 2 years.

## 2020-10-06 NOTE — Telephone Encounter (Signed)
Patient advised. See phone message.

## 2020-10-06 NOTE — Telephone Encounter (Signed)
Patient advised and verbalized understanding. Patient agrees with treatment plan. Prescription sent into pharmacy.

## 2020-10-28 ENCOUNTER — Other Ambulatory Visit: Payer: Self-pay | Admitting: Family Medicine

## 2020-10-28 NOTE — Telephone Encounter (Signed)
Appt 6/22- patient advised per lab continue current medication- f/u in December

## 2020-11-10 ENCOUNTER — Telehealth: Payer: Self-pay

## 2020-11-10 DIAGNOSIS — U071 COVID-19: Secondary | ICD-10-CM

## 2020-11-10 MED ORDER — ALBUTEROL SULFATE HFA 108 (90 BASE) MCG/ACT IN AERS: 2.0000 | INHALATION_SPRAY | RESPIRATORY_TRACT | 5 refills | Status: AC | PRN

## 2020-11-10 MED ORDER — MOLNUPIRAVIR EUA 200MG CAPSULE: 4.0000 | ORAL_CAPSULE | Freq: Two times a day (BID) | ORAL | 0 refills | Status: AC

## 2020-11-10 NOTE — Telephone Encounter (Signed)
[facility]  

## 2020-11-10 NOTE — Telephone Encounter (Signed)
Carmell [place] advised.  She is also requesting a refill on her inhaler just in case she needs it. (She has had Proair in the past.)  Thanks,   -Mickel Baas

## 2020-11-10 NOTE — Telephone Encounter (Signed)
Copied from [place] [id number]. Topic: General - Inquiry >> Nov 10, 2020  8:25 AM [place] wrote: Reason for CRM: Pt and granddaughter on the phone, Pt has tested positive for covid, tired no energy, some coughing, congestion, fever 99 yesterday, due to age wanting to get some antiviral meds/ FU with pt granddaughter Carmell [place], @ [phone] can 3 way her grandmother if needed. Main reason for call antiviral asap

## 2020-11-10 NOTE — Addendum Note (Signed)
Addended by: Birdie Sons on: 11/10/2020 01:22 PM   Modules accepted: Orders

## 2020-12-15 ENCOUNTER — Other Ambulatory Visit: Payer: Self-pay

## 2020-12-15 ENCOUNTER — Encounter: Payer: Self-pay | Admitting: Podiatry

## 2020-12-15 ENCOUNTER — Ambulatory Visit: Payer: PPO | Admitting: Podiatry

## 2020-12-15 DIAGNOSIS — D2371 Other benign neoplasm of skin of right lower limb, including hip: Secondary | ICD-10-CM

## 2020-12-15 DIAGNOSIS — D2372 Other benign neoplasm of skin of left lower limb, including hip: Secondary | ICD-10-CM

## 2020-12-15 NOTE — Progress Notes (Signed)
She presents today after having not seen her since February of last year with a chief complaint of painful areas to the plantar aspect of the bilateral foot.  States that she tried trimming them off herself but still very tender.  Objective: Vital signs are stable alert and oriented x3 there is no erythema edema cellulitis drainage or odor punctated benign lesion subsecond metatarsals bilaterally most likely secondary to the hallux valgus deformities that are present.  Assessment: Pain in limb secondary to benign skin lesions and hallux valgus with hammertoe deformities.  Plan: Debridement of benign skin lesions today chemical destruction of these lesions to be left on for 3 days and then washed off thoroughly I will follow-up with her in 6 weeks or as needed.

## 2020-12-26 ENCOUNTER — Other Ambulatory Visit: Payer: Self-pay | Admitting: Family Medicine

## 2020-12-26 DIAGNOSIS — M810 Age-related osteoporosis without current pathological fracture: Secondary | ICD-10-CM

## 2020-12-26 NOTE — Telephone Encounter (Signed)
Requested Prescriptions  Pending Prescriptions Disp Refills  . alendronate (FOSAMAX) 70 MG tablet [Pharmacy Med Name: ALENDRONATE SODIUM 70 MG TAB] 12 tablet 0    Sig: TAKE 1 TABLET BY MOUTH EVERY 7 (SEVEN) DAYS. TAKE WITH A FULL GLASS OF WATER ON AN EMPTY STOMACH.     Endocrinology:  Bisphosphonates Failed - 12/26/2020  1:25 AM      Failed - Valid encounter within last 12 months    Recent Outpatient Visits          3 months ago Annual physical exam   [facility] Birdie Sons, MD   1 year ago Essential (primary) hypertension   [place], Kirstie Peri, MD   1 year ago Essential (primary) hypertension   [facility] Birdie Sons, MD   3 years ago Helicobacter pylori gastritis   [facility] Birdie Sons, MD   3 years ago Generalized abdominal pain   [place], MD      Future Appointments            In 2 months Fisher, Kirstie Peri, MD [facility], [place] in normal range and within 360 days    Calcium  Date Value Ref Range Status  09/08/2020 9.4 8.7 - 10.3 mg/dL Final         Passed - Vitamin D in normal range and within 360 days    Vit D, 25-Hydroxy  Date Value Ref Range Status  09/08/2020 58.8 30.0 - 100.0 ng/mL Final    Comment:    Vitamin D deficiency has been defined by the Institute of Medicine and an Endocrine Society practice guideline as a level of serum 25-OH vitamin D less than 20 ng/mL (1,2). The Endocrine Society went on to further define vitamin D insufficiency as a level between 21 and 29 ng/mL (2). 1. IOM (Institute of Medicine). 2010. Dietary reference    intakes for calcium and D. [place]: The    [employer]. 2. Holick MF, Binkley [place], Bischoff-Ferrari HA, et al.    Evaluation, treatment, and prevention of vitamin D    deficiency: an Endocrine Society clinical practice    guideline. JCEM. 2011 Jul;  96(7):1911-30.

## 2021-03-15 ENCOUNTER — Ambulatory Visit (INDEPENDENT_AMBULATORY_CARE_PROVIDER_SITE_OTHER): Payer: PPO | Admitting: Family Medicine

## 2021-03-15 ENCOUNTER — Other Ambulatory Visit: Payer: Self-pay | Admitting: Family Medicine

## 2021-03-15 ENCOUNTER — Encounter: Payer: Self-pay | Admitting: Family Medicine

## 2021-03-15 ENCOUNTER — Other Ambulatory Visit: Payer: Self-pay

## 2021-03-15 VITALS — BP 138/66 | HR 75 | Temp 98.7°F | Resp 16 | Wt 120.0 lb

## 2021-03-15 DIAGNOSIS — Z23 Encounter for immunization: Secondary | ICD-10-CM | POA: Diagnosis not present

## 2021-03-15 DIAGNOSIS — E785 Hyperlipidemia, unspecified: Secondary | ICD-10-CM | POA: Diagnosis not present

## 2021-03-15 DIAGNOSIS — I10 Essential (primary) hypertension: Secondary | ICD-10-CM | POA: Diagnosis not present

## 2021-03-15 DIAGNOSIS — E039 Hypothyroidism, unspecified: Secondary | ICD-10-CM

## 2021-03-15 DIAGNOSIS — E559 Vitamin D deficiency, unspecified: Secondary | ICD-10-CM | POA: Diagnosis not present

## 2021-03-15 DIAGNOSIS — M810 Age-related osteoporosis without current pathological fracture: Secondary | ICD-10-CM

## 2021-03-15 DIAGNOSIS — R7303 Prediabetes: Secondary | ICD-10-CM

## 2021-03-15 NOTE — Progress Notes (Signed)
Established patient visit   Patient: Sophia Anderson   DOB: 11-17-1935   85 y.o. Female  MRN: [id number] Visit Date: 03/15/2021  Today's healthcare provider: Lelon Huh, MD   Chief Complaint  Patient presents with   Hypertension   Sophia Anderson,acting as a scribe for Lelon Huh, MD.,have documented all relevant documentation on the behalf of Lelon Huh, MD,as directed by  Lelon Huh, MD while in the presence of Lelon Huh, MD.  Subjective    HPI  Hypertension, follow-up  BP Readings from Last 3 Encounters:  03/15/21 138/66  09/08/20 (!) 146/62  10/14/19 (!) 130/58   Wt Readings from Last 3 Encounters:  03/15/21 120 lb (54.4 kg)  09/08/20 127 lb (57.6 kg)  10/14/19 127 lb (57.6 kg)     She was last seen for hypertension 6 months ago.  BP at that visit was 146/62 . Management since that visit includes NO CHANGES.  She reports excellent compliance with treatment. She is not having side effects.  She is following a Regular diet. She is exercising. She does not smoke.  Use of agents associated with hypertension: none.   Outside blood pressures are stable. Symptoms: No chest pain No chest pressure  No palpitations No syncope  No dyspnea No orthopnea  No paroxysmal nocturnal dyspnea Yes lower extremity edema   Pertinent labs: Lab Results  Component Value Date   CHOL 242 (H) 09/08/2020   HDL 69 09/08/2020   LDLCALC 149 (H) 09/08/2020   TRIG 135 09/08/2020   CHOLHDL 3.5 09/08/2020   Lab Results  Component Value Date   NA 139 09/08/2020   K 4.6 09/08/2020   CREATININE 0.70 09/08/2020   EGFR 86 09/08/2020   GLUCOSE 120 (H) 09/08/2020   TSH 0.545 09/08/2020     The ASCVD Risk score (Arnett DK, et al., 2019) failed to calculate for the following reasons:   The 2019 ASCVD risk score is only valid for ages 47 to 11    ---------------------------------------------------------------------------------------------------   Medications: Outpatient Medications Prior to Visit  Medication Sig   albuterol (PROAIR HFA) 108 (90 Base) MCG/ACT inhaler Inhale 2 puffs into the lungs every 4 (four) hours as needed.   alendronate (FOSAMAX) 70 MG tablet TAKE 1 TABLET BY MOUTH EVERY 7 (SEVEN) DAYS. TAKE WITH A FULL GLASS OF WATER ON AN EMPTY STOMACH.   amLODipine (NORVASC) 5 MG tablet Take 1 tablet by mouth daily.   ezetimibe (ZETIA) 10 MG tablet TAKE 1 TABLET BY MOUTH EVERYDAY AT BEDTIME   levothyroxine (SYNTHROID) 88 MCG tablet Take 1 tablet by mouth daily.   naproxen (NAPROSYN) 375 MG tablet TAKE 1 TABLET (375 MG TOTAL) BY MOUTH 2 (TWO) TIMES DAILY AS NEEDED FOR MILD PAIN. TAKE WITH MEALS   traZODone (DESYREL) 100 MG tablet TAKE 0.5-1 TABLETS (50-100 MG TOTAL) BY MOUTH AT BEDTIME.   triamcinolone (NASACORT) 55 MCG/ACT AERO nasal inhaler Place into the nose.    Vitamin D, Ergocalciferol, (DRISDOL) 1.25 MG (50000 UNIT) CAPS capsule TAKE 1 CAPSULE BY MOUTH ONE TIME PER WEEK   No facility-administered medications prior to visit.    Review of Systems  Constitutional:  Negative for activity change, appetite change and fatigue.  Respiratory:  Negative for chest tightness and shortness of breath.   Cardiovascular:  Negative for chest pain and palpitations.       Objective    BP 138/66 (BP Location: Right Arm, Patient Position: Sitting, Cuff Size: Normal)    Pulse 75  Temp 98.7 F (37.1 C) (Oral)    Resp 16    Wt 120 lb (54.4 kg)    SpO2 100%    BMI 21.95 kg/m  BP Readings from Last 3 Encounters:  03/15/21 138/66  09/08/20 (!) 146/62  10/14/19 (!) 130/58   Wt Readings from Last 3 Encounters:  03/15/21 120 lb (54.4 kg)  09/08/20 127 lb (57.6 kg)  10/14/19 127 lb (57.6 kg)     General appearance: Well developed, well nourished female, cooperative and in no acute distress Head: Normocephalic, without obvious  abnormality, atraumatic Respiratory: Respirations even and unlabored, normal respiratory rate Extremities: All extremities are intact.  Skin: Skin color, texture, turgor normal. No rashes seen  Psych: Appropriate mood and affect. Neurologic: Mental status: Alert, oriented to person, place, and time, thought content appropriate.     Assessment & Plan     1. Essential (primary) hypertension Well controlled.  Continue current medications.   - CBC  2. Adult hypothyroidism  - TSH  3. Vitamin D deficiency  - VITAMIN D 25 Hydroxy (Vit-D Deficiency, Fractures)  4. Prediabetes  - Hemoglobin A1c  5. Hyperlipidemia, unspecified hyperlipidemia type  - Lipid panel  6. Need for influenza vaccination Flu vaccine given today        The entirety of the information documented in the History of Present Illness, Review of Systems and Physical Exam were personally obtained by me. Portions of this information were initially documented by the CMA and reviewed by me for thoroughness and accuracy.     Lelon Huh, MD  [facility] [phone] (phone) [phone] (fax)  [place]

## 2021-03-16 LAB — CBC
Hematocrit: 41.7 % (ref 34.0–46.6)
Hemoglobin: 14.1 g/dL (ref 11.1–15.9)
MCH: 28.7 pg (ref 26.6–33.0)
MCHC: 33.8 g/dL (ref 31.5–35.7)
MCV: 85 fL (ref 79–97)
Platelets: 268 10*3/uL (ref 150–450)
RBC: 4.92 x10E6/uL (ref 3.77–5.28)
RDW: 13.1 % (ref 11.7–15.4)
WBC: 5.7 10*3/uL (ref 3.4–10.8)

## 2021-03-16 LAB — LIPID PANEL
Chol/HDL Ratio: 2.9 ratio (ref 0.0–4.4)
Cholesterol, Total: 215 mg/dL — ABNORMAL HIGH (ref 100–199)
HDL: 73 mg/dL (ref >39)
LDL Chol Calc (NIH): 127 mg/dL — ABNORMAL HIGH (ref 0–99)
Triglycerides: 85 mg/dL (ref 0–149)
VLDL Cholesterol Cal: 15 mg/dL (ref 5–40)

## 2021-03-16 LAB — HEMOGLOBIN A1C
Est. average glucose Bld gHb Est-mCnc: 114 mg/dL
Hgb A1c MFr Bld: 5.6 % (ref 4.8–5.6)

## 2021-03-16 LAB — VITAMIN D 25 HYDROXY (VIT D DEFICIENCY, FRACTURES): Vit D, 25-Hydroxy: 98.2 ng/mL (ref 30.0–100.0)

## 2021-03-16 LAB — TSH: TSH: 1.4 u[IU]/mL (ref 0.450–4.500)

## 2021-03-18 DIAGNOSIS — H43813 Vitreous degeneration, bilateral: Secondary | ICD-10-CM | POA: Diagnosis not present

## 2021-03-31 DIAGNOSIS — H2511 Age-related nuclear cataract, right eye: Secondary | ICD-10-CM | POA: Diagnosis not present

## 2021-04-01 ENCOUNTER — Encounter: Payer: Self-pay | Admitting: Ophthalmology

## 2021-04-07 NOTE — Discharge Instructions (Signed)

## 2021-04-12 ENCOUNTER — Ambulatory Visit: Payer: PPO | Admitting: Anesthesiology

## 2021-04-12 ENCOUNTER — Encounter
Admission: RE | Disposition: A | Discharge status: Home / Self Care | Payer: Self-pay | Source: Home / Self Care | Attending: Ophthalmology

## 2021-04-12 ENCOUNTER — Ambulatory Visit
Admission: RE | Admit: 2021-04-12 | Discharge: 2021-04-12 | Disposition: A | Discharge status: Home / Self Care | Payer: PPO | Attending: Ophthalmology | Admitting: Ophthalmology

## 2021-04-12 DIAGNOSIS — F419 Anxiety disorder, unspecified: Secondary | ICD-10-CM | POA: Insufficient documentation

## 2021-04-12 DIAGNOSIS — Z79899 Other long term (current) drug therapy: Secondary | ICD-10-CM | POA: Diagnosis not present

## 2021-04-12 DIAGNOSIS — Z79890 Hormone replacement therapy (postmenopausal): Secondary | ICD-10-CM | POA: Insufficient documentation

## 2021-04-12 DIAGNOSIS — I10 Essential (primary) hypertension: Secondary | ICD-10-CM | POA: Insufficient documentation

## 2021-04-12 DIAGNOSIS — K219 Gastro-esophageal reflux disease without esophagitis: Secondary | ICD-10-CM | POA: Diagnosis not present

## 2021-04-12 DIAGNOSIS — E039 Hypothyroidism, unspecified: Secondary | ICD-10-CM | POA: Insufficient documentation

## 2021-04-12 DIAGNOSIS — Z7951 Long term (current) use of inhaled steroids: Secondary | ICD-10-CM | POA: Diagnosis not present

## 2021-04-12 DIAGNOSIS — Z87891 Personal history of nicotine dependence: Secondary | ICD-10-CM | POA: Insufficient documentation

## 2021-04-12 DIAGNOSIS — H2511 Age-related nuclear cataract, right eye: Secondary | ICD-10-CM | POA: Insufficient documentation

## 2021-04-12 DIAGNOSIS — H25811 Combined forms of age-related cataract, right eye: Secondary | ICD-10-CM | POA: Diagnosis not present

## 2021-04-12 DIAGNOSIS — E785 Hyperlipidemia, unspecified: Secondary | ICD-10-CM | POA: Insufficient documentation

## 2021-04-12 HISTORY — DX: Disorder of thyroid, unspecified: E07.9

## 2021-04-12 HISTORY — DX: Hyperlipidemia, unspecified: E78.5

## 2021-04-12 HISTORY — PX: CATARACT EXTRACTION W/PHACO: SHX586

## 2021-04-12 HISTORY — DX: Essential (primary) hypertension: I10

## 2021-04-12 SURGERY — PHACOEMULSIFICATION, CATARACT, WITH IOL INSERTION
Anesthesia: Monitor Anesthesia Care | Site: Eye | Laterality: Right

## 2021-04-12 MED ORDER — BRIMONIDINE TARTRATE-TIMOLOL 0.2-0.5 % OP SOLN
OPHTHALMIC | Status: DC | PRN
  Administered 2021-04-12: 1 [drp] via OPHTHALMIC

## 2021-04-12 MED ORDER — MIDAZOLAM HCL 2 MG/2ML IJ SOLN
INTRAMUSCULAR | Status: DC | PRN
Start: 2021-04-12 — End: 2021-04-12
  Administered 2021-04-12: 1 mg via INTRAVENOUS

## 2021-04-12 MED ORDER — SIGHTPATH DOSE#1 NA CHONDROIT SULF-NA HYALURON 40-17 MG/ML IO SOLN
INTRAOCULAR | Status: DC | PRN
  Administered 2021-04-12: 1 mL via INTRAOCULAR

## 2021-04-12 MED ORDER — SIGHTPATH DOSE#1 BSS IO SOLN
INTRAOCULAR | Status: DC | PRN
  Administered 2021-04-12: 1 mL via INTRAMUSCULAR

## 2021-04-12 MED ORDER — SIGHTPATH DOSE#1 BSS IO SOLN
INTRAOCULAR | Status: DC | PRN
  Administered 2021-04-12: 15 mL

## 2021-04-12 MED ORDER — FENTANYL CITRATE (PF) 100 MCG/2ML IJ SOLN
INTRAMUSCULAR | Status: DC | PRN
  Administered 2021-04-12: 50 ug via INTRAVENOUS

## 2021-04-12 MED ORDER — SIGHTPATH DOSE#1 BSS IO SOLN
INTRAOCULAR | Status: DC | PRN
  Administered 2021-04-12: 12:00:00 44 mL via OPHTHALMIC

## 2021-04-12 MED ORDER — TETRACAINE HCL 0.5 % OP SOLN
1.0000 [drp] | OPHTHALMIC | Status: DC | PRN
  Administered 2021-04-12 (×3): 1 [drp] via OPHTHALMIC

## 2021-04-12 MED ORDER — ARMC OPHTHALMIC DILATING DROPS
1.0000 "application " | OPHTHALMIC | Status: DC | PRN
  Administered 2021-04-12 (×3): 1 via OPHTHALMIC

## 2021-04-12 MED ORDER — MOXIFLOXACIN HCL 0.5 % OP SOLN
OPHTHALMIC | Status: DC | PRN
  Administered 2021-04-12: 0.2 mL via OPHTHALMIC

## 2021-04-12 SURGICAL SUPPLY — 10 items
CATARACT SUITE SIGHTPATH (MISCELLANEOUS) ×3 IMPLANT
FEE CATARACT SUITE SIGHTPATH (MISCELLANEOUS) ×1 IMPLANT
GLOVE SURG ENC TEXT LTX SZ8 (GLOVE) ×3 IMPLANT
GLOVE SURG TRIUMPH 8.0 PF LTX (GLOVE) ×3 IMPLANT
LENS IOL TECNIS EYHANCE 23.0 (Intraocular Lens) ×2 IMPLANT
NDL FILTER BLUNT 18X1 1/2 (NEEDLE) ×1 IMPLANT
NEEDLE FILTER BLUNT 18X 1/2SAF (NEEDLE) ×2
NEEDLE FILTER BLUNT 18X1 1/2 (NEEDLE) ×1 IMPLANT
SYR 3ML LL SCALE MARK (SYRINGE) ×3 IMPLANT
WATER STERILE IRR 250ML POUR (IV SOLUTION) ×3 IMPLANT

## 2021-04-12 NOTE — Anesthesia Postprocedure Evaluation (Signed)
Anesthesia Post Note  Patient: Sophia Anderson  Procedure(s) Performed: CATARACT EXTRACTION PHACO AND INTRAOCULAR LENS PLACEMENT (IOC) RIGHT 4.13 00:30.1 (Right: Eye)     Patient location during evaluation: PACU Anesthesia Type: MAC Level of consciousness: awake Pain management: pain level controlled Vital Signs Assessment: post-procedure vital signs reviewed and stable Respiratory status: respiratory function stable Cardiovascular status: stable Postop Assessment: no apparent nausea or vomiting Anesthetic complications: no   No notable events documented.  Veda Canning

## 2021-04-12 NOTE — Anesthesia Procedure Notes (Signed)
Procedure Name: MAC Date/Time: 04/12/2021 11:21 AM Performed by: Cameron Ali, CRNA Pre-anesthesia Checklist: Patient identified, Emergency Drugs available, Suction available, Timeout performed and Patient being monitored Patient Re-evaluated:Patient Re-evaluated prior to induction Oxygen Delivery Method: Nasal cannula Placement Confirmation: positive ETCO2

## 2021-04-12 NOTE — Transfer of Care (Signed)
Immediate Anesthesia Transfer of Care Note  Patient: Sophia Anderson  Procedure(s) Performed: CATARACT EXTRACTION PHACO AND INTRAOCULAR LENS PLACEMENT (IOC) RIGHT 4.13 00:30.1 (Right: Eye)  Patient Location: PACU  Anesthesia Type: MAC  Level of Consciousness: awake, alert  and patient cooperative  Airway and Oxygen Therapy: Patient Spontanous Breathing and Patient connected to supplemental oxygen  Post-op Assessment: Post-op Vital signs reviewed, Patient's Cardiovascular Status Stable, Respiratory Function Stable, Patent Airway and No signs of Nausea or vomiting  Post-op Vital Signs: Reviewed and stable  Complications: No notable events documented.

## 2021-04-12 NOTE — Anesthesia Preprocedure Evaluation (Signed)
Anesthesia Evaluation  Patient identified by MRN, date of birth, ID band Patient awake    Reviewed: Allergy & Precautions, NPO status   Airway Mallampati: II  TM Distance: >3 FB     Dental   Pulmonary former smoker,    Pulmonary exam normal        Cardiovascular hypertension,  Rhythm:Regular Rate:Normal     Neuro/Psych Anxiety    GI/Hepatic GERD  ,  Endo/Other  Hypothyroidism   Renal/GU      Musculoskeletal   Abdominal   Peds  Hematology   Anesthesia Other Findings   Reproductive/Obstetrics                             Anesthesia Physical Anesthesia Plan  ASA: 2  Anesthesia Plan: MAC   Post-op Pain Management: Minimal or no pain anticipated   Induction: Intravenous  PONV Risk Score and Plan: TIVA, Midazolam and Treatment may vary due to age or medical condition  Airway Management Planned: Natural Airway and Nasal Cannula  Additional Equipment:   Intra-op Plan:   Post-operative Plan:   Informed Consent: I have reviewed the patients History and Physical, chart, labs and discussed the procedure including the risks, benefits and alternatives for the proposed anesthesia with the patient or authorized representative who has indicated his/her understanding and acceptance.       Plan Discussed with: CRNA  Anesthesia Plan Comments:         Anesthesia Quick Evaluation

## 2021-04-12 NOTE — Op Note (Signed)
PREOPERATIVE DIAGNOSIS:  Nuclear sclerotic cataract of the right eye.   POSTOPERATIVE DIAGNOSIS:  H25.11 Cataract   OPERATIVE PROCEDURE:ORPROCALL@   SURGEON:  Birder Robson, MD.   ANESTHESIA:  Anesthesiologist: Veda Canning, MD CRNA: Mayme Genta, CRNA; Cameron Ali, CRNA  1.      Managed anesthesia care. 2.      0.35ml of Shugarcaine was instilled in the eye following the paracentesis.   COMPLICATIONS:  None.   TECHNIQUE:   Stop and chop   DESCRIPTION OF PROCEDURE:  The patient was examined and consented in the preoperative holding area where the aforementioned topical anesthesia was applied to the right eye and then brought back to the Operating Room where the right eye was prepped and draped in the usual sterile ophthalmic fashion and a lid speculum was placed. A paracentesis was created with the side port blade and the anterior chamber was filled with viscoelastic. A near clear corneal incision was performed with the steel keratome. A continuous curvilinear capsulorrhexis was performed with a cystotome followed by the capsulorrhexis forceps. Hydrodissection and hydrodelineation were carried out with BSS on a blunt cannula. The lens was removed in a stop and chop  technique and the remaining cortical material was removed with the irrigation-aspiration handpiece. The capsular bag was inflated with viscoelastic and the Technis [id number]  lens was placed in the capsular bag without complication. The remaining viscoelastic was removed from the eye with the irrigation-aspiration handpiece. The wounds were hydrated. The anterior chamber was flushed with BSS and the eye was inflated to physiologic pressure. 0.39ml of Vigamox was placed in the anterior chamber. The wounds were found to be water tight. The eye was dressed with Combigan. The patient was given protective glasses to wear throughout the day and a shield with which to sleep tonight. The patient was also given drops with which to begin a  drop regimen today and will follow-up with me in one day. Implant Name Type Inv. Item Serial No. Manufacturer Lot No. LRB No. Used Action  LENS IOL TECNIS EYHANCE 23.0 - [id number] Intraocular Lens LENS IOL TECNIS EYHANCE 23.0 [id number] [employer]  Right 1 Implanted   Procedure(s): CATARACT EXTRACTION PHACO AND INTRAOCULAR LENS PLACEMENT (IOC) RIGHT 4.13 00:30.1 (Right)  Electronically signed: Birder Robson 04/12/2021 11:33 AM

## 2021-04-12 NOTE — H&P (Signed)
[facility]   Primary Care Physician:  Birdie Sons, MD Ophthalmologist: Dr. George Ina  Pre-Procedure History & Physical: HPI:  Sophia Anderson is a 86 y.o. female here for cataract surgery.   Past Medical History:  Diagnosis Date   Hyperlipidemia    Hypertension    Thyroid disease     Past Surgical History:  Procedure Laterality Date   ABDOMINAL HYSTERECTOMY  1982   vaginal and oophorectomy with incidental appendectomy   APPENDECTOMY     accidental durin hysterectomy and oophorectomy   EYE SURGERY     left eye cataract surgery    Prior to Admission medications   Medication Sig Start Date End Date Taking? Authorizing Provider  albuterol (PROAIR HFA) 108 (90 Base) MCG/ACT inhaler Inhale 2 puffs into the lungs every 4 (four) hours as needed. 11/10/20  Yes Birdie Sons, MD  alendronate (FOSAMAX) 70 MG tablet TAKE 1 TABLET BY MOUTH EVERY 7 (SEVEN) DAYS. TAKE WITH A FULL GLASS OF WATER ON AN EMPTY STOMACH. 03/15/21  Yes Birdie Sons, MD  amLODipine (NORVASC) 5 MG tablet Take 1 tablet by mouth daily. 02/28/20  Yes [provider]  ezetimibe (ZETIA) 10 MG tablet TAKE 1 TABLET BY MOUTH EVERYDAY AT BEDTIME 10/28/20  Yes Birdie Sons, MD  levothyroxine (SYNTHROID) 88 MCG tablet Take 1 tablet by mouth daily. 12/11/19  Yes [provider]  triamcinolone (NASACORT) 55 MCG/ACT AERO nasal inhaler Place into the nose.    Yes [provider]  Vitamin D, Ergocalciferol, (DRISDOL) 1.25 MG (50000 UNIT) CAPS capsule TAKE 1 CAPSULE BY MOUTH ONE TIME PER WEEK 10/03/20  Yes Birdie Sons, MD    Allergies as of 03/23/2021 - Review Complete 03/15/2021  Allergen Reaction Noted   Fluvastatin sodium  05/21/2015   Penicillins  05/21/2015   Welchol  [colesevelam hcl]  05/21/2015   Propranolol  09/09/2019    Family History  Problem Relation Age of Onset   Emphysema Father    Congestive Heart Failure Sister    Stroke Sister    Breast cancer Sister     Heart attack Brother     Social History   Socioeconomic History   Marital status: Widowed    Spouse name: Not on file   Number of children: 1   Years of education: Not on file   Highest education level: Not on file  Occupational History   Occupation: Self employed    Comment: Works one day a week in a [employer]  Tobacco Use   Smoking status: Former    Types: Cigarettes   Smokeless tobacco: Never   Tobacco comments:    Smoked < 1 ppd and quit 30+ years ago  Scientific laboratory technician Use: Never used  Substance and Sexual Activity   Alcohol use: Not Currently    Alcohol/week: 0.0 standard drinks   Drug use: No   Sexual activity: Not on file  Other Topics Concern   Not on file  Social History Narrative   Not on file   Social Determinants of Health   Financial Resource Strain: Not on file  Food Insecurity: Not on file  Transportation Needs: Not on file  Physical Activity: Not on file  Stress: Not on file  Social Connections: Not on file  Intimate Partner Violence: Not on file    Review of Systems: See HPI, otherwise negative ROS  Physical Exam: BP (!) 144/82    Pulse 81    Temp (!) 97.4  F (36.3 C) (Temporal)    Resp 16    Ht 5\' 1"  (1.549 m)    Wt 54.9 kg    SpO2 99%    BMI 22.86 kg/m  General:   Alert, cooperative in NAD Head:  Normocephalic and atraumatic. Respiratory:  Normal work of breathing. Cardiovascular:  RRR  Impression/Plan: Sophia Anderson is here for cataract surgery.  Risks, benefits, limitations, and alternatives regarding cataract surgery have been reviewed with the patient.  Questions have been answered.  All parties agreeable.   Birder Robson, MD  04/12/2021, 11:10 AM

## 2021-04-13 ENCOUNTER — Encounter: Payer: Self-pay | Admitting: Ophthalmology

## 2021-04-25 ENCOUNTER — Other Ambulatory Visit: Payer: Self-pay | Admitting: Family Medicine

## 2021-07-20 ENCOUNTER — Encounter: Payer: Self-pay | Admitting: Family Medicine

## 2021-07-20 ENCOUNTER — Ambulatory Visit: Payer: Self-pay

## 2021-07-20 ENCOUNTER — Ambulatory Visit (INDEPENDENT_AMBULATORY_CARE_PROVIDER_SITE_OTHER): Payer: PPO | Admitting: Family Medicine

## 2021-07-20 VITALS — BP 134/64 | HR 82 | Temp 97.6°F | Resp 14 | Wt 123.7 lb

## 2021-07-20 DIAGNOSIS — G459 Transient cerebral ischemic attack, unspecified: Secondary | ICD-10-CM

## 2021-07-20 DIAGNOSIS — R42 Dizziness and giddiness: Secondary | ICD-10-CM

## 2021-07-20 NOTE — Telephone Encounter (Signed)
? ? ?  Chief Complaint: Dizziness  ?Symptoms: Feels shaky, had some SOB. ?Frequency: Happened last night ?Pertinent Negatives: Patient denies chest pain ?Disposition: '[]'$ ED /'[]'$ Urgent Care (no appt availability in office) / '[x]'$ Appointment(In office/virtual)/ '[]'$  [place] [facility]/ '[]'$ Home Care/ '[]'$ Refused Recommended Disposition /'[]'$ [place] Mobile [facility]/ '[]'$  Follow-up with PCP ?Additional Notes: Instructed to go to ED if she has chest pain.  ?Reason for Disposition ? [1] MODERATE dizziness (e.g., interferes with normal activities) AND [2] has NOT been evaluated by physician for this  (Exception: dizziness caused by heat exposure, sudden standing, or poor fluid intake) ? ?Answer Assessment - Initial Assessment Questions ?1. DESCRIPTION: "Describe your dizziness." ?    Dizzy ?2. LIGHTHEADED: "Do you feel lightheaded?" (e.g., somewhat faint, woozy, weak upon standing) ?    Woozy ?3. VERTIGO: "Do you feel like either you or the room is spinning or tilting?" (i.e. vertigo) ?    Yes ?4. SEVERITY: "How bad is it?"  "Do you feel like you are going to faint?" "Can you stand and walk?" ?  - MILD: Feels slightly dizzy, but walking normally. ?  - MODERATE: Feels unsteady when walking, but not falling; interferes with normal activities (e.g., school, work). ?  - SEVERE: Unable to walk without falling, or requires assistance to walk without falling; feels like passing out now.  ?    Moderate ?5. ONSET:  "When did the dizziness begin?" ?    Last night ?6. AGGRAVATING FACTORS: "Does anything make it worse?" (e.g., standing, change in head position) ?    Standing ?7. HEART RATE: "Can you tell me your heart rate?" "How many beats in 15 seconds?"  (Note: not all patients can do this)   ?    No ?8. CAUSE: "What do you think is causing the dizziness?" ?    Unsure ?9. RECURRENT SYMPTOM: "Have you had dizziness before?" If Yes, ask: "When was the last time?" "What happened that time?" ?    No ?10. OTHER SYMPTOMS: "Do you have any other  symptoms?" (e.g., fever, chest pain, vomiting, diarrhea, bleeding) ?      Shaking ?11. PREGNANCY: "Is there any chance you are pregnant?" "When was your last menstrual period?" ?      No ? ?Protocols used: Dizziness - Lightheadedness-A-AH ? ?

## 2021-07-20 NOTE — Patient Instructions (Signed)
Please review the attached list of medications and notify my office if there are any errors.  ? ?Please bring all of your medications to every appointment so we can make sure that our medication list is the same as yours.  ? ?Please go to the lab draw station in [address] on the second floor of [facility] . Normal hours are 8:00am to 11:30am and 1:00pm to 4:00pm Monday through Friday  ?

## 2021-07-20 NOTE — Progress Notes (Signed)
?  ? ?I,Jana Robinson,acting as a scribe for Lelon Huh, MD.,have documented all relevant documentation on the behalf of Lelon Huh, MD,as directed by  Lelon Huh, MD while in the presence of Lelon Huh, MD. ? ? ?Established patient visit ? ? ?Patient: Sophia Anderson   DOB: July 10, 1935   86 y.o. Female  MRN: [id number] ?Visit Date: 07/20/2021 ? ?Today's healthcare provider: Lelon Huh, MD  ? ?Chief Complaint  ?Patient presents with  ? Dizziness  ? ?Subjective  ?  ?Patient presents for dizziness.  Feels shaky, "woozy" and has had some shortness of breath.   Reports 1 episode last evening while lying down on the sofa and lasting approximately 5 hours.  Room was spinning.  Denies dizziness since waking this morning but still feels shaky. Started acutely while watering plants, but was not leaning over or making any rapid head movements. Denies chest pain.  ? ?Also reports right shoulder pain with motion.  Patient was picking up a case of water and it slipped on Saturday.  Heard a loud crack.  Discomfort is better but is sore with range of motion.  Took 2 ibuprofen twice yesterday, a few hours before dizzy spell.  ? ? ?Medications: ?Outpatient Medications Prior to Visit  ?Medication Sig  ? alendronate (FOSAMAX) 70 MG tablet TAKE 1 TABLET BY MOUTH EVERY 7 (SEVEN) DAYS. TAKE WITH A FULL GLASS OF WATER ON AN EMPTY STOMACH.  ? amLODipine (NORVASC) 5 MG tablet Take 1 tablet by mouth daily.  ? ezetimibe (ZETIA) 10 MG tablet TAKE 1 TABLET BY MOUTH EVERYDAY AT BEDTIME  ? levothyroxine (SYNTHROID) 88 MCG tablet Take 1 tablet by mouth daily.  ? Vitamin D, Ergocalciferol, (DRISDOL) 1.25 MG (50000 UNIT) CAPS capsule TAKE 1 CAPSULE BY MOUTH ONE TIME PER WEEK  ? albuterol (PROAIR HFA) 108 (90 Base) MCG/ACT inhaler Inhale 2 puffs into the lungs every 4 (four) hours as needed. (Patient not taking: Reported on 07/20/2021)  ? triamcinolone (NASACORT) 55 MCG/ACT AERO nasal inhaler Place into the nose.  (Patient not taking:  Reported on 07/20/2021)  ? ?No facility-administered medications prior to visit.  ? ? ?Review of Systems  ?Neurological:  Positive for dizziness.  ? ? ?  Objective  ?  ?BP 134/64 (BP Location: Left Arm, Patient Position: Sitting, Cuff Size: Normal)   Pulse 82   Temp 97.6 ?F (36.4 ?C) (Oral)   Resp 14   Wt 123 lb 11.2 oz (56.1 kg)   SpO2 98%   BMI 23.37 kg/m?  ? ? ?Physical Exam  ? ?General: Appearance:    Well developed, well nourished female in no acute distress  ?Eyes:    PERRL, conjunctiva/corneas clear, EOM's intact       ?Lungs:     Clear to auscultation bilaterally, respirations unlabored  ?Heart:    Normal heart rate. Normal rhythm. No murmurs, rubs, or gallops.    ?MS:   All extremities are intact.    ?Neurologic:   Awake, alert, oriented x 3. No apparent focal neurological defect.   ?   ?  ?EKG-NSR, no acute changes.  ? Assessment & Plan  ?  ? ?1. Dizziness ?Possible BPV, although not typical and not precipitated by sudden head movement. Mostly resolved today. Concerned about CNS event such as TIA ? ?2. TIA (transient ischemic attack) (suspected) ? ?-- CBC ?- Comprehensive metabolic panel ?- TSH ?- US Carotid Duplex Bilateral; Future  ? ?Recommend she start taking 35mg ECASA while work up is in process.  ?   ? ?  The entirety of the information documented in the History of Present Illness, Review of Systems and Physical Exam were personally obtained by me. Portions of this information were initially documented by the CMA and reviewed by me for thoroughness and accuracy.   ? ? ?Lelon Huh, MD  ?[facility] ?[phone] (phone) ?[phone] (fax) ? ?[facility] ?

## 2021-07-21 ENCOUNTER — Telehealth: Payer: Self-pay | Admitting: *Deleted

## 2021-07-21 LAB — CBC
Hematocrit: 41.5 % (ref 34.0–46.6)
Hemoglobin: 14.4 g/dL (ref 11.1–15.9)
MCH: 29.6 pg (ref 26.6–33.0)
MCHC: 34.7 g/dL (ref 31.5–35.7)
MCV: 85 fL (ref 79–97)
Platelets: 266 10*3/uL (ref 150–450)
RBC: 4.86 x10E6/uL (ref 3.77–5.28)
RDW: 13 % (ref 11.7–15.4)
WBC: 7 10*3/uL (ref 3.4–10.8)

## 2021-07-21 LAB — COMPREHENSIVE METABOLIC PANEL
ALT: 18 IU/L (ref 0–32)
AST: 22 IU/L (ref 0–40)
Albumin/Globulin Ratio: 1.6 (ref 1.2–2.2)
Albumin: 4.5 g/dL (ref 3.6–4.6)
Alkaline Phosphatase: 82 IU/L (ref 44–121)
BUN/Creatinine Ratio: 29 — ABNORMAL HIGH (ref 12–28)
BUN: 20 mg/dL (ref 8–27)
Bilirubin Total: 0.5 mg/dL (ref 0.0–1.2)
CO2: 25 mmol/L (ref 20–29)
Calcium: 9.3 mg/dL (ref 8.7–10.3)
Chloride: 100 mmol/L (ref 96–106)
Creatinine, Ser: 0.68 mg/dL (ref 0.57–1.00)
Globulin, Total: 2.9 g/dL (ref 1.5–4.5)
Glucose: 140 mg/dL — ABNORMAL HIGH (ref 70–99)
Potassium: 4.1 mmol/L (ref 3.5–5.2)
Sodium: 138 mmol/L (ref 134–144)
Total Protein: 7.4 g/dL (ref 6.0–8.5)
eGFR: 86 mL/min/{1.73_m2} (ref >59)

## 2021-07-21 LAB — TSH: TSH: 0.441 u[IU]/mL — ABNORMAL LOW (ref 0.450–4.500)

## 2021-07-21 NOTE — Telephone Encounter (Signed)
Labs show she is a little bit hyPER thyroid, which may be causing some shaking, although would not cause dizziness. She should put levothyroxine on hold a couple of days until the carotid ultrasound gets down. If any other symptoms are progressing then she should go to ER for urgent evaluation.  ?

## 2021-07-21 NOTE — Telephone Encounter (Signed)
Pt and granddaughter are still waiting for return call. ?Granddaughter is concerned that there will not be a call back - she want her grand mother seen sooner for testing.  ?

## 2021-07-21 NOTE — Telephone Encounter (Signed)
?  Chief Complaint: Information ?Symptoms:  ?Frequency:  ?Pertinent Negatives: Patient denies  ?Disposition: '[]'$ ED /'[]'$ Urgent Care (no appt availability in office) / '[]'$ Appointment(In office/virtual)/ '[]'$  [place] [facility]/ '[]'$ Home Care/ '[]'$ Refused Recommended Disposition /'[]'$ [place] [facility]/ '[x]'$  Follow-up with PCP ?Additional Notes:   Pt's grandaughter Ms. [employer], on [place]. Requesting "More information" regarding OV of yesterday. ?States she is concerned as pt is still SOB, still shaky. Also states more confused past 2 days. Questioning if CT should be done to R/O evolving stroke. Also requesting lab results (not released). Information provided regarding OP Carotid US imaging order,states she will call them. Please advise. ?CB# [phone] ?

## 2021-07-22 NOTE — Telephone Encounter (Signed)
Tried calling patient. No answer. Left message to call back.  ? ?I returned granddaughter Sophia Anderson's call and advised her of Dr. Maralyn Sago message below. Sophia Austria verbalized understanding and agrees to relay information to her grandmother.  ?

## 2021-07-25 ENCOUNTER — Other Ambulatory Visit: Payer: Self-pay

## 2021-07-25 ENCOUNTER — Ambulatory Visit
Admission: RE | Admit: 2021-07-25 | Discharge: 2021-07-25 | Disposition: A | Discharge status: Home / Self Care | Payer: PPO | Source: Ambulatory Visit | Attending: Family Medicine | Admitting: Family Medicine

## 2021-07-25 DIAGNOSIS — I6523 Occlusion and stenosis of bilateral carotid arteries: Secondary | ICD-10-CM | POA: Diagnosis not present

## 2021-07-25 DIAGNOSIS — G459 Transient cerebral ischemic attack, unspecified: Secondary | ICD-10-CM | POA: Diagnosis not present

## 2021-07-25 DIAGNOSIS — R42 Dizziness and giddiness: Secondary | ICD-10-CM | POA: Insufficient documentation

## 2021-07-27 ENCOUNTER — Telehealth: Payer: Self-pay

## 2021-07-27 NOTE — Telephone Encounter (Signed)
Copied from [place] [id number]. Topic: General - Inquiry ?>> Jul 27, 2021 11:07 AM Loma Boston wrote: ?Reason for CRM: Pt granddaughter, Ramonita Lab and pt have called re being anxious to here result of artery imaging ck on the 3rd pls fu at [phone] ?

## 2021-07-28 NOTE — Telephone Encounter (Signed)
Sophia Anderson returned call for status update, please advise. She is requesting a call back today to discuss the results.  ?

## 2021-07-28 NOTE — Telephone Encounter (Signed)
Patient's graddaughter Ramonita Lab aware of results. She is going to call back and schedule follow-up appointment. ?

## 2021-09-09 ENCOUNTER — Telehealth: Payer: Self-pay | Admitting: Family Medicine

## 2021-09-23 ENCOUNTER — Telehealth: Payer: Self-pay

## 2021-09-23 NOTE — Telephone Encounter (Signed)
Copied from [place] [id number]. Topic: General - Other >> Sep 23, 2021  4:37 PM [place] B wrote: Reason for TVD:FPBHEBB wants to cancel jul 10 appt for AWV. She says she doesn't want to reschedule because she doesn't think she needs it and she will be out of town.

## 2021-10-25 ENCOUNTER — Telehealth: Payer: Self-pay | Admitting: Family Medicine

## 2021-10-25 NOTE — Telephone Encounter (Signed)
Copied from [place] [id number]. Topic: Medicare AWV >> Oct 25, 2021  1:05 PM Jae Dire wrote: Reason for CRM:   Left message for patient to call back and schedule Medicare Annual Wellness Visit (AWV) in office.   If unable to come into the office for AWV,  please offer to do virtually or by telephone.  Last AWV: 09/08/2020  Please schedule at anytime with [facility] Health Advisor.  30 minute appointment for Virtual or phone 45 minute appointment for in office or Initial virtual/phone  Any questions, please contact me at [phone]

## 2021-11-03 DIAGNOSIS — H43813 Vitreous degeneration, bilateral: Secondary | ICD-10-CM | POA: Diagnosis not present

## 2021-12-13 ENCOUNTER — Ambulatory Visit
Admission: RE | Admit: 2021-12-13 | Discharge: 2021-12-13 | Disposition: A | Discharge status: Home / Self Care | Payer: PPO | Attending: Family Medicine | Admitting: Family Medicine

## 2021-12-13 ENCOUNTER — Encounter: Payer: Self-pay | Admitting: Family Medicine

## 2021-12-13 ENCOUNTER — Ambulatory Visit (INDEPENDENT_AMBULATORY_CARE_PROVIDER_SITE_OTHER): Payer: PPO | Admitting: Family Medicine

## 2021-12-13 ENCOUNTER — Ambulatory Visit
Admission: RE | Admit: 2021-12-13 | Discharge: 2021-12-13 | Disposition: A | Discharge status: Home / Self Care | Payer: PPO | Source: Ambulatory Visit | Attending: Family Medicine | Admitting: Family Medicine

## 2021-12-13 VITALS — BP 133/59 | HR 91 | Resp 16 | Ht 62.0 in | Wt 124.0 lb

## 2021-12-13 DIAGNOSIS — R2242 Localized swelling, mass and lump, left lower limb: Secondary | ICD-10-CM

## 2021-12-13 DIAGNOSIS — M1612 Unilateral primary osteoarthritis, left hip: Secondary | ICD-10-CM | POA: Diagnosis not present

## 2021-12-13 DIAGNOSIS — M47816 Spondylosis without myelopathy or radiculopathy, lumbar region: Secondary | ICD-10-CM | POA: Diagnosis not present

## 2021-12-13 DIAGNOSIS — M160 Bilateral primary osteoarthritis of hip: Secondary | ICD-10-CM | POA: Diagnosis not present

## 2021-12-13 DIAGNOSIS — I878 Other specified disorders of veins: Secondary | ICD-10-CM | POA: Diagnosis not present

## 2021-12-13 NOTE — Progress Notes (Signed)
      I,Tiffany J Bragg,acting as a scribe for Lelon Huh, MD.,have documented all relevant documentation on the behalf of Lelon Huh, MD,as directed by  Lelon Huh, MD while in the presence of Lelon Huh, MD.  Established patient visit   Patient: Sophia Anderson   DOB: 15-Jan-1936   86 y.o. Female  MRN: [id number] Visit Date: 12/13/2021  Today's healthcare provider: Lelon Huh, MD    Subjective    HPI   She reports swelling on lateral aspect of left hip for several months, but has been painful, radiating into left lateral leg for the last 3-4 days. No coloration changes. No fevers, chills sweats, nausea, vomiting. A bit of tingling into her leg. No known injury. Able to bear weight without difficulty.   Medications: Outpatient Medications Prior to Visit  Medication Sig   albuterol (PROAIR HFA) 108 (90 Base) MCG/ACT inhaler Inhale 2 puffs into the lungs every 4 (four) hours as needed.   alendronate (FOSAMAX) 70 MG tablet TAKE 1 TABLET BY MOUTH EVERY 7 (SEVEN) DAYS. TAKE WITH A FULL GLASS OF WATER ON AN EMPTY STOMACH.   amLODipine (NORVASC) 5 MG tablet Take 1 tablet by mouth daily.   ezetimibe (ZETIA) 10 MG tablet TAKE 1 TABLET BY MOUTH EVERYDAY AT BEDTIME   levothyroxine (SYNTHROID) 88 MCG tablet Take 1 tablet by mouth daily.   triamcinolone (NASACORT) 55 MCG/ACT AERO nasal inhaler Place into the nose.   Vitamin D, Ergocalciferol, (DRISDOL) 1.25 MG (50000 UNIT) CAPS capsule TAKE 1 CAPSULE BY MOUTH ONE TIME PER WEEK   No facility-administered medications prior to visit.    Review of Systems  Constitutional:  Negative for appetite change, chills, fatigue and fever.  Respiratory:  Negative for chest tightness and shortness of breath.   Cardiovascular:  Negative for chest pain and palpitations.  Gastrointestinal:  Negative for abdominal pain, nausea and vomiting.  Neurological:  Negative for dizziness and weakness.       Objective    BP (!) 133/59 (BP Location: Right  Arm, Patient Position: Sitting, Cuff Size: Normal)   Pulse 91   Resp 16   Ht '5\' 2"'$  (1.575 m)   Wt 124 lb (56.2 kg)   SpO2 97%   BMI 22.68 kg/m    Physical Exam  Firm slightly tender golf ball sized mass left lateral hip. Does not move. No skin lesions. +5/5 strength left upper extremity.    Assessment & Plan     1. Mass of hip region, left Unclear if this is bony or soft tissue lesion. - DG Hip Unilat W OR W/O Pelvis 2-3 Views Left; Future   Will likely need advanced imaging if plain film xrays are not diagnostic.      The entirety of the information documented in the History of Present Illness, Review of Systems and Physical Exam were personally obtained by me. Portions of this information were initially documented by the CMA and reviewed by me for thoroughness and accuracy.     Lelon Huh, MD  [facility] [phone] (phone) [phone] (fax)  [place]

## 2021-12-14 ENCOUNTER — Telehealth: Payer: Self-pay

## 2021-12-14 ENCOUNTER — Other Ambulatory Visit: Payer: Self-pay | Admitting: Family Medicine

## 2021-12-14 DIAGNOSIS — R2242 Localized swelling, mass and lump, left lower limb: Secondary | ICD-10-CM

## 2021-12-14 NOTE — Telephone Encounter (Signed)
Patient advised and agrees to have CT. Please schedule. Patient says that the phone company is working on her phone line, so we may need to contact her granddaughter if she doesn't answer the phone.   Granddaughter [phone]

## 2021-12-14 NOTE — Telephone Encounter (Signed)
Copied from [place] [id number]. Topic: General - Inquiry >> Dec 14, 2021  1:30 PM Charlotte Sanes J wrote: Reason for CRM: Pt would like a call about her imaging result but please call her grand daughters phone de to her phone being down / please advise

## 2021-12-14 NOTE — Telephone Encounter (Signed)
Please see result note from hip xray

## 2021-12-14 NOTE — Telephone Encounter (Signed)
-----   Message from Birdie Sons, MD sent at 12/14/2021  3:53 PM EDT ----- Correction, will order CT for evaluation sine she is claustrophobic.

## 2021-12-15 NOTE — Telephone Encounter (Signed)
Patient's grand daughter Sophia Anderson was advised of results. Sophia Anderson is requesting a medication for when patient has the MRI. The patient is highly claustrophobic. Please advise?

## 2021-12-21 ENCOUNTER — Other Ambulatory Visit: Payer: Self-pay | Admitting: Family Medicine

## 2021-12-22 ENCOUNTER — Ambulatory Visit: Admission: RE | Admit: 2021-12-22 | Payer: PPO | Source: Ambulatory Visit

## 2021-12-29 ENCOUNTER — Ambulatory Visit
Admission: RE | Admit: 2021-12-29 | Discharge: 2021-12-29 | Disposition: A | Discharge status: Home / Self Care | Payer: PPO | Source: Ambulatory Visit | Attending: Family Medicine | Admitting: Family Medicine

## 2021-12-29 DIAGNOSIS — R2242 Localized swelling, mass and lump, left lower limb: Secondary | ICD-10-CM | POA: Insufficient documentation

## 2021-12-29 DIAGNOSIS — M25552 Pain in left hip: Secondary | ICD-10-CM | POA: Diagnosis not present

## 2021-12-29 DIAGNOSIS — M7989 Other specified soft tissue disorders: Secondary | ICD-10-CM | POA: Diagnosis not present

## 2021-12-29 LAB — POCT I-STAT CREATININE: Creatinine, Ser: 0.7 mg/dL (ref 0.44–1.00)

## 2021-12-29 MED ORDER — IOHEXOL 300 MG/ML  SOLN
75.0000 mL | Freq: Once | INTRAMUSCULAR | Status: AC | PRN
  Administered 2021-12-29: 75 mL via INTRAVENOUS

## 2021-12-30 ENCOUNTER — Telehealth: Payer: Self-pay | Admitting: Family Medicine

## 2021-12-30 NOTE — Telephone Encounter (Signed)
Patient inquiring about imaging results and would like a follow up call. Informed caller PCP is out of the office today

## 2022-01-02 ENCOUNTER — Telehealth: Payer: Self-pay | Admitting: *Deleted

## 2022-01-02 NOTE — Telephone Encounter (Signed)
Imaging result note read to pt's granddaughter, Carmell Austria, on [place]. Verbalizes understanding.   "CT scan is normal. No tumors or bony abnormalities. Probably inflamed swollen tendons putting pressure on adjacent nerves causing pain to go into leg. Recommend alternate applying heat and ice 3-4 times a day."

## 2022-02-08 ENCOUNTER — Telehealth: Payer: Self-pay | Admitting: Family Medicine

## 2022-02-08 NOTE — Telephone Encounter (Signed)
Left message for patient to call back and schedule Medicare Annual Wellness Visit (AWV) in office.   If not able to come in office, please offer to do virtually or by telephone.  Left office number and my jabber [phone].  Last AWV:09/08/2020   Please schedule at anytime with Nurse Health Advisor.

## 2022-03-01 ENCOUNTER — Telehealth: Payer: Self-pay | Admitting: Family Medicine

## 2022-03-01 NOTE — Telephone Encounter (Signed)
Left message for patient to call back and schedule Medicare Annual Wellness Visit (AWV) in office.   If not able to come in office, please offer to do virtually or by telephone.  Left office number and my jabber [phone].  Last AWV:09/08/2020   Please schedule at anytime with Nurse Health Advisor.

## 2022-03-14 ENCOUNTER — Telehealth: Payer: Self-pay | Admitting: Family Medicine

## 2022-03-14 NOTE — Telephone Encounter (Signed)
Left message for patient to call back and schedule Medicare Annual Wellness Visit (AWV) in office.   If not able to come in office, please offer to do virtually or by telephone.  Left office number and my jabber [phone].  Last AWV:09/08/2020   Please schedule at anytime with Nurse Health Advisor.

## 2022-03-27 ENCOUNTER — Telehealth: Payer: Self-pay | Admitting: Family Medicine

## 2022-03-27 NOTE — Telephone Encounter (Signed)
Called patients granddaughter, informing her that we have been trying to reach patient, have LVM with no reply, to schedule AWV with [facility], States she will call her at lunch and call me back to schedule.

## 2022-03-30 ENCOUNTER — Other Ambulatory Visit: Payer: Self-pay | Admitting: Family Medicine

## 2022-03-30 DIAGNOSIS — M810 Age-related osteoporosis without current pathological fracture: Secondary | ICD-10-CM

## 2022-03-30 NOTE — Telephone Encounter (Signed)
Requested Prescriptions  Pending Prescriptions Disp Refills   alendronate (FOSAMAX) 70 MG tablet [Pharmacy Med Name: ALENDRONATE SODIUM 70 MG TAB] 12 tablet 0    Sig: TAKE 1 TABLET BY MOUTH EVERY 7 (SEVEN) DAYS. TAKE WITH A FULL GLASS OF WATER ON AN EMPTY STOMACH.     Endocrinology:  Bisphosphonates Failed - 03/30/2022  1:29 AM      Failed - Vitamin D in normal range and within 360 days    Vit D, 25-Hydroxy  Date Value Ref Range Status  03/15/2021 98.2 30.0 - 100.0 ng/mL Final    Comment:    Vitamin D deficiency has been defined by the [place] practice guideline as a level of serum 25-OH vitamin D less than 20 ng/mL (1,2). The Endocrine Society went on to further define vitamin D insufficiency as a level between 21 and 29 ng/mL (2). 1. IOM (Institute of Medicine). 2010. Dietary reference    intakes for calcium and D. [place]: The    [employer]. 2. Holick MF, Binkley [place], Bischoff-Ferrari HA, et al.    Evaluation, treatment, and prevention of vitamin D    deficiency: an Endocrine Society clinical practice    guideline. JCEM. 2011 Jul; 96(7):1911-30.          Failed - Mg Level in normal range and within 360 days    No results found for: "MG"       Failed - Phosphate in normal range and within 360 days    No results found for: "PHOS"       Passed - Ca in normal range and within 360 days    Calcium  Date Value Ref Range Status  07/20/2021 9.3 8.7 - 10.3 mg/dL Final         Passed - Cr in normal range and within 360 days    Creat  Date Value Ref Range Status  02/23/2017 0.70 0.60 - 0.93 mg/dL Final    Comment:    For patients >79 years of age, the reference limit for Creatinine is approximately 13% higher for people identified as African-American. .    Creatinine, Ser  Date Value Ref Range Status  12/29/2021 0.70 0.44 - 1.00 mg/dL Final         Passed - eGFR is 30 or above and within 360 days    GFR, Est  African American  Date Value Ref Range Status  02/23/2017 96 > OR = 60 mL/min/1.71m Final   GFR calc Af Amer  Date Value Ref Range Status  09/09/2019 86 >59 mL/min/1.73 Final    Comment:    **[facility] currently reports eGFR in compliance with the current**   recommendations of the [employer] [facility] will   update reporting as new guidelines are published from the NKF-ASN   Task force.    GFR, Est Non African American  Date Value Ref Range Status  02/23/2017 82 > OR = 60 mL/min/1.740mFinal   GFR calc non Af Amer  Date Value Ref Range Status  09/09/2019 75 >59 mL/min/1.73 Final   eGFR  Date Value Ref Range Status  07/20/2021 86 >59 mL/min/1.73 Final         Passed - Valid encounter within last 12 months    Recent Outpatient Visits           3 months ago [place], [place]   [facility]   8 months ago Dizziness  [facility] Caryn Section, Kirstie Peri, MD   1 year ago Essential (primary) hypertension   [facility] Birdie Sons, MD   1 year ago Annual physical exam   [facility] Birdie Sons, MD   2 years ago Essential (primary) hypertension   [facility] Birdie Sons, MD              Passed - Bone Mineral Density or Dexa Scan completed in the last 2 years

## 2022-06-18 ENCOUNTER — Other Ambulatory Visit: Payer: Self-pay | Admitting: Family Medicine

## 2022-06-20 ENCOUNTER — Other Ambulatory Visit: Payer: Self-pay | Admitting: Family Medicine

## 2022-06-20 DIAGNOSIS — M810 Age-related osteoporosis without current pathological fracture: Secondary | ICD-10-CM

## 2022-06-20 NOTE — Telephone Encounter (Signed)
Requested Prescriptions  Pending Prescriptions Disp Refills   alendronate (FOSAMAX) 70 MG tablet [Pharmacy Med Name: ALENDRONATE SODIUM 70 MG TAB] 12 tablet 0    Sig: TAKE 1 TABLET BY MOUTH EVERY 7 (SEVEN) DAYS. TAKE WITH A FULL GLASS OF WATER ON AN EMPTY STOMACH.     Endocrinology:  Bisphosphonates Failed - 06/20/2022  2:16 AM      Failed - Vitamin D in normal range and within 360 days    Vit D, 25-Hydroxy  Date Value Ref Range Status  03/15/2021 98.2 30.0 - 100.0 ng/mL Final    Comment:    Vitamin D deficiency has been defined by the [place] practice guideline as a level of serum 25-OH vitamin D less than 20 ng/mL (1,2). The Endocrine Society went on to further define vitamin D insufficiency as a level between 21 and 29 ng/mL (2). 1. IOM (Institute of Medicine). 2010. Dietary reference    intakes for calcium and D. [place]: The    [employer]. 2. Holick MF, Binkley [place], Bischoff-Ferrari HA, et al.    Evaluation, treatment, and prevention of vitamin D    deficiency: an Endocrine Society clinical practice    guideline. JCEM. 2011 Jul; 96(7):1911-30.          Failed - Mg Level in normal range and within 360 days    No results found for: "MG"       Failed - Phosphate in normal range and within 360 days    No results found for: "PHOS"       Passed - Ca in normal range and within 360 days    Calcium  Date Value Ref Range Status  07/20/2021 9.3 8.7 - 10.3 mg/dL Final         Passed - Cr in normal range and within 360 days    Creat  Date Value Ref Range Status  02/23/2017 0.70 0.60 - 0.93 mg/dL Final    Comment:    For patients >85 years of age, the reference limit for Creatinine is approximately 13% higher for people identified as African-American. .    Creatinine, Ser  Date Value Ref Range Status  12/29/2021 0.70 0.44 - 1.00 mg/dL Final         Passed - eGFR is 30 or above and within 360 days    GFR, Est  African American  Date Value Ref Range Status  02/23/2017 96 > OR = 60 mL/min/1.9m2 Final   GFR calc Af Amer  Date Value Ref Range Status  09/09/2019 86 >59 mL/min/1.73 Final    Comment:    **[facility] currently reports eGFR in compliance with the current**   recommendations of the [employer]. [facility] will   update reporting as new guidelines are published from the NKF-ASN   Task force.    GFR, Est Non African American  Date Value Ref Range Status  02/23/2017 82 > OR = 60 mL/min/1.74m2 Final   GFR calc non Af Amer  Date Value Ref Range Status  09/09/2019 75 >59 mL/min/1.73 Final   eGFR  Date Value Ref Range Status  07/20/2021 86 >59 mL/min/1.73 Final         Passed - Valid encounter within last 12 months    Recent Outpatient Visits           6 months ago Mass of hip region, left   [facility] Birdie Sons, MD   11 months ago  Dizziness   [facility] Birdie Sons, MD   1 year ago Essential (primary) hypertension   [facility] Birdie Sons, MD   1 year ago Annual physical exam   [facility] Birdie Sons, MD   2 years ago Essential (primary) hypertension   [place] Birdie Sons, MD              Passed - Bone Mineral Density or Dexa Scan completed in the last 2 years

## 2022-08-23 ENCOUNTER — Ambulatory Visit (INDEPENDENT_AMBULATORY_CARE_PROVIDER_SITE_OTHER): Payer: PPO

## 2022-08-23 VITALS — BP 118/68 | Ht 62.0 in | Wt 125.9 lb

## 2022-08-23 DIAGNOSIS — Z0000 Encounter for general adult medical examination without abnormal findings: Secondary | ICD-10-CM | POA: Diagnosis not present

## 2022-08-23 NOTE — Patient Instructions (Signed)
Sophia Anderson , Thank you for taking time to come for your Medicare Wellness Visit. I appreciate your ongoing commitment to your health goals. Please review the following plan we discussed and let me know if I can assist you in the future.   These are the goals we discussed:  Goals      DIET - INCREASE WATER INTAKE     Recommend increasing water intake to 4 glasses a day.      Increase water intake     Starting 03/15/16, I will increase my water intake to 3 glasses a day.        This is a list of the screening recommended for you and due dates:  Health Maintenance  Topic Date Due   DTaP/Tdap/Td vaccine (1 - Tdap) Never done   Zoster (Shingles) Vaccine (1 of 2) Never done   COVID-19 Vaccine (3 - Pfizer risk series) 05/26/2019   DEXA scan (bone density measurement)  09/29/2022   Flu Shot  10/19/2022   Medicare Annual Wellness Visit  08/23/2023   Pneumonia Vaccine  Completed   HPV Vaccine  Aged Out    Advanced directives: yes  Conditions/risks identified: none  Next appointment: Follow up in one year for your annual wellness visit 08/28/2023 @11 :45am in person   Preventive Care 65 Years and Older, Female Preventive care refers to lifestyle choices and visits with your health care provider that can promote health and wellness. What does preventive care include? A yearly physical exam. This is also called an annual well check. Dental exams once or twice a year. Routine eye exams. Ask your health care provider how often you should have your eyes checked. Personal lifestyle choices, including: Daily care of your teeth and gums. Regular physical activity. Eating a healthy diet. Avoiding tobacco and drug use. Limiting alcohol use. Practicing safe sex. Taking low-dose aspirin every day. Taking vitamin and mineral supplements as recommended by your health care provider. What happens during an annual well check? The services and screenings done by your health care provider during  your annual well check will depend on your age, overall health, lifestyle risk factors, and family history of disease. Counseling  Your health care provider may ask you questions about your: Alcohol use. Tobacco use. Drug use. Emotional well-being. Home and relationship well-being. Sexual activity. Eating habits. History of falls. Memory and ability to understand (cognition). Work and work Astronomer. Reproductive health. Screening  You may have the following tests or measurements: Height, weight, and BMI. Blood pressure. Lipid and cholesterol levels. These may be checked every 5 years, or more frequently if you are over 73 years old. Skin check. Lung cancer screening. You may have this screening every year starting at age 24 if you have a 30-pack-year history of smoking and currently smoke or have quit within the past 15 years. Fecal occult blood test (FOBT) of the stool. You may have this test every year starting at age 33. Flexible sigmoidoscopy or colonoscopy. You may have a sigmoidoscopy every 5 years or a colonoscopy every 10 years starting at age 38. Hepatitis C blood test. Hepatitis B blood test. Sexually transmitted disease (STD) testing. Diabetes screening. This is done by checking your blood sugar (glucose) after you have not eaten for a while (fasting). You may have this done every 1-3 years. Bone density scan. This is done to screen for osteoporosis. You may have this done starting at age 45. Mammogram. This may be done every 1-2 years. Talk to your health  care provider about how often you should have regular mammograms. Talk with your health care provider about your test results, treatment options, and if necessary, the need for more tests. Vaccines  Your health care provider may recommend certain vaccines, such as: Influenza vaccine. This is recommended every year. Tetanus, diphtheria, and acellular pertussis (Tdap, Td) vaccine. You may need a Td booster every 10  years. Zoster vaccine. You may need this after age 16. Pneumococcal 13-valent conjugate (PCV13) vaccine. One dose is recommended after age 40. Pneumococcal polysaccharide (PPSV23) vaccine. One dose is recommended after age 11. Talk to your health care provider about which screenings and vaccines you need and how often you need them. This information is not intended to replace advice given to you by your health care provider. Make sure you discuss any questions you have with your health care provider. Document Released: 04/02/2015 Document Revised: 11/24/2015 Document Reviewed: 01/05/2015 Elsevier Interactive Patient Education  2017 [employer].  Fall Prevention in the Home Falls can cause injuries. They can happen to people of all ages. There are many things you can do to make your home safe and to help prevent falls. What can I do on the outside of my home? Regularly fix the edges of walkways and driveways and fix any cracks. Remove anything that might make you trip as you walk through a door, such as a raised step or threshold. Trim any bushes or trees on the path to your home. Use bright outdoor lighting. Clear any walking paths of anything that might make someone trip, such as rocks or tools. Regularly check to see if handrails are loose or broken. Make sure that both sides of any steps have handrails. Any raised decks and porches should have guardrails on the edges. Have any leaves, snow, or ice cleared regularly. Use sand or salt on walking paths during winter. Clean up any spills in your garage right away. This includes oil or grease spills. What can I do in the bathroom? Use night lights. Install grab bars by the toilet and in the tub and shower. Do not use towel bars as grab bars. Use non-skid mats or decals in the tub or shower. If you need to sit down in the shower, use a plastic, non-slip stool. Keep the floor dry. Clean up any water that spills on the floor as soon as it  happens. Remove soap buildup in the tub or shower regularly. Attach bath mats securely with double-sided non-slip rug tape. Do not have throw rugs and other things on the floor that can make you trip. What can I do in the bedroom? Use night lights. Make sure that you have a light by your bed that is easy to reach. Do not use any sheets or blankets that are too big for your bed. They should not hang down onto the floor. Have a firm chair that has side arms. You can use this for support while you get dressed. Do not have throw rugs and other things on the floor that can make you trip. What can I do in the kitchen? Clean up any spills right away. Avoid walking on wet floors. Keep items that you use a lot in easy-to-reach places. If you need to reach something above you, use a strong step stool that has a grab bar. Keep electrical cords out of the way. Do not use floor polish or wax that makes floors slippery. If you must use wax, use non-skid floor wax. Do not have throw  rugs and other things on the floor that can make you trip. What can I do with my stairs? Do not leave any items on the stairs. Make sure that there are handrails on both sides of the stairs and use them. Fix handrails that are broken or loose. Make sure that handrails are as long as the stairways. Check any carpeting to make sure that it is firmly attached to the stairs. Fix any carpet that is loose or worn. Avoid having throw rugs at the top or bottom of the stairs. If you do have throw rugs, attach them to the floor with carpet tape. Make sure that you have a light switch at the top of the stairs and the bottom of the stairs. If you do not have them, ask someone to add them for you. What else can I do to help prevent falls? Wear shoes that: Do not have high heels. Have rubber bottoms. Are comfortable and fit you well. Are closed at the toe. Do not wear sandals. If you use a stepladder: Make sure that it is fully opened.  Do not climb a closed stepladder. Make sure that both sides of the stepladder are locked into place. Ask someone to hold it for you, if possible. Clearly mark and make sure that you can see: Any grab bars or handrails. First and last steps. Where the edge of each step is. Use tools that help you move around (mobility aids) if they are needed. These include: Canes. Walkers. Scooters. Crutches. Turn on the lights when you go into a dark area. Replace any light bulbs as soon as they burn out. Set up your furniture so you have a clear path. Avoid moving your furniture around. If any of your floors are uneven, fix them. If there are any pets around you, be aware of where they are. Review your medicines with your doctor. Some medicines can make you feel dizzy. This can increase your chance of falling. Ask your doctor what other things that you can do to help prevent falls. This information is not intended to replace advice given to you by your health care provider. Make sure you discuss any questions you have with your health care provider. Document Released: 12/31/2008 Document Revised: 08/12/2015 Document Reviewed: 04/10/2014 Elsevier Interactive Patient Education  2017 [employer].

## 2022-08-23 NOTE — Progress Notes (Signed)
Subjective:   Sophia Anderson is a 87 y.o. female who presents for Medicare Annual (Subsequent) preventive examination.  Review of Systems    Cardiac Risk Factors include: advanced age (>84men, >36 women);dyslipidemia;hypertension    Objective:    Today's Vitals   08/23/22 1151  Weight: 125 lb 14.4 oz (57.1 kg)  Height: 5\' 2"  (1.575 m)   Body mass index is 23.03 kg/m.     08/23/2022   12:03 PM 04/12/2021   10:28 AM 02/22/2017    2:15 PM 03/15/2016    1:15 PM  Advanced Directives  Does Patient Have a Medical Advance Directive? Yes No Yes Yes  Type of Estate agent of [place];Living will  Healthcare Power of [place] Choice;Living will Living will;Healthcare Power of Attorney  Copy of Healthcare Power of Attorney in Chart?   No - copy requested No - copy requested    Current Medications (verified) Outpatient Encounter Medications as of 08/23/2022  Medication Sig   alendronate (FOSAMAX) 70 MG tablet TAKE 1 TABLET BY MOUTH EVERY 7 (SEVEN) DAYS. TAKE WITH A FULL GLASS OF WATER ON AN EMPTY STOMACH.   amLODipine (NORVASC) 5 MG tablet TAKE 1 TABLET BY MOUTH EVERY DAY   ezetimibe (ZETIA) 10 MG tablet TAKE 1 TABLET BY MOUTH EVERYDAY AT BEDTIME   levothyroxine (SYNTHROID) 88 MCG tablet TAKE 1 TABLET BY MOUTH EVERY DAY   Vitamin D, Ergocalciferol, (DRISDOL) 1.25 MG (50000 UNIT) CAPS capsule TAKE 1 CAPSULE BY MOUTH ONE TIME PER WEEK   albuterol (PROAIR HFA) 108 (90 Base) MCG/ACT inhaler Inhale 2 puffs into the lungs every 4 (four) hours as needed.   triamcinolone (NASACORT) 55 MCG/ACT AERO nasal inhaler Place into the nose. (Patient not taking: Reported on 08/23/2022)   No facility-administered encounter medications on file as of 08/23/2022.    Allergies (verified) Fluvastatin sodium, Penicillins, Welchol  [colesevelam hcl], and Propranolol   History: Past Medical History:  Diagnosis Date   Hyperlipidemia    Hypertension    Thyroid disease    Past Surgical History:   Procedure Laterality Date   ABDOMINAL HYSTERECTOMY  1982   vaginal and oophorectomy with incidental appendectomy   APPENDECTOMY     accidental durin hysterectomy and oophorectomy   CATARACT EXTRACTION W/PHACO Right 04/12/2021   Procedure: CATARACT EXTRACTION PHACO AND INTRAOCULAR LENS PLACEMENT (IOC) RIGHT 4.13 00:30.1;  Surgeon: Galen Manila, MD;  Location: [facility];  Service: Ophthalmology;  Laterality: Right;   EYE SURGERY     left eye cataract surgery   Family History  Problem Relation Age of Onset   Emphysema Father    Congestive Heart Failure Sister    Stroke Sister    Breast cancer Sister    Heart attack Brother    Social History   Socioeconomic History   Marital status: Widowed    Spouse name: Not on file   Number of children: 1   Years of education: Not on file   Highest education level: Not on file  Occupational History   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, music  Tobacco Use   Smoking status: Former    Types: Cigarettes   Smokeless tobacco: Never   Tobacco comments:    Smoked < 1 ppd and quit 30+ years ago  Building services engineer Use: Never used  Substance and Sexual Activity   Alcohol use: Not Currently    Alcohol/week: 0.0 standard drinks of alcohol   Drug  use: No   Sexual activity: Not on file  Other Topics Concern   Not on file  Social History Narrative   Not on file   Social Determinants of Health   Financial Resource Strain: Low Risk  (08/23/2022)   Overall Financial Resource Strain (CARDIA)    Difficulty of Paying Living Expenses: Not hard at all  Food Insecurity: No Food Insecurity (08/23/2022)   Hunger Vital Sign    Worried About Running Out of Food in the Last Year: Never true    Ran Out of Food in the Last Year: Never true  Transportation Needs: No Transportation Needs (08/23/2022)   PRAPARE - Administrator, Civil Service (Medical): No    Lack of Transportation (Non-Medical): No   Physical Activity: Sufficiently Active (08/23/2022)   Exercise Vital Sign    Days of Exercise per Week: 6 days    Minutes of Exercise per Session: 30 min  Stress: Stress Concern Present (08/23/2022)   [employer] of Occupational Health - Occupational Stress Questionnaire    Feeling of Stress : To some extent  Social Connections: Moderately Isolated (08/23/2022)   Social Connection and Isolation Panel [NHANES]    Frequency of Communication with Friends and Family: More than three times a week    Frequency of Social Gatherings with Friends and Family: More than three times a week    Attends Religious Services: 1 to 4 times per year    Active Member of [employer] or Organizations: No    Attends Banker Meetings: Never    Marital Status: Widowed    Tobacco Counseling Counseling given: Not Answered Tobacco comments: Smoked < 1 ppd and quit 30+ years ago   Clinical Intake:  Pre-visit preparation completed: Yes  Pain : No/denies pain     BMI - recorded: 23.03 Nutritional Status: BMI of 19-24  Normal Nutritional Risks: None Diabetes: No  How often do you need to have someone help you when you read instructions, pamphlets, or other written materials from your doctor or pharmacy?: 1 - Never  Diabetic?no  Interpreter Needed?: No  Comments: lives alone Information entered by :: B.Odie Edmonds,LPN   Activities of Daily Living    08/23/2022   12:03 PM 12/13/2021    1:29 PM  In your present state of health, do you have any difficulty performing the following activities:  Hearing? 0 0  Vision? 0 1  Difficulty concentrating or making decisions? 0 0  Walking or climbing stairs? 0 1  Dressing or bathing? 0 0  Doing errands, shopping? 0 0  Preparing Food and eating ? N   Using the Toilet? N   In the past six months, have you accidently leaked urine? N   Do you have problems with loss of bowel control? N   Managing your Medications? N   Managing your Finances? N    Housekeeping or managing your Housekeeping? N     Patient Care Team: Malva Limes, MD as PCP - General (Family Medicine) Elinor Parkinson, DPM as Consulting Physician (Podiatry)  Indicate any recent Medical Services you may have received from other than [facility] providers in the past year (date may be approximate).     Assessment:   This is a routine wellness examination for Zud[place].  Hearing/Vision screen Hearing Screening - Comments:: Adequate hearing Vision Screening - Comments:: Adequate Vision  Dietary issues and exercise activities discussed: Current Exercise Habits: Home exercise routine, Type of exercise: walking, Time (Minutes): 30, Frequency (Times/Week): 6,  Weekly Exercise (Minutes/Week): 180, Intensity: Mild, Exercise limited by: None identified   Goals Addressed             This Visit's Progress    DIET - INCREASE WATER INTAKE   On track    Recommend increasing water intake to 4 glasses a day.      Increase water intake   On track    Starting 03/15/16, I will increase my water intake to 3 glasses a day.       Depression Screen    08/23/2022   11:59 AM 12/13/2021    1:29 PM 07/20/2021   11:31 AM 03/15/2021   10:10 AM 09/08/2020   11:06 AM 09/09/2019   10:35 AM 02/22/2017    2:16 PM  PHQ 2/9 Scores  PHQ - 2 Score 0 1 0 0 0 0 0  PHQ- 9 Score  4 2  3       Fall Risk    08/23/2022   11:56 AM 12/13/2021    1:29 PM 07/20/2021   11:31 AM 03/15/2021   10:10 AM 09/08/2020   11:06 AM  Fall Risk   Falls in the past year? 0 0 0 0 0  Number falls in past yr: 0 0 0 0 0  Injury with Fall? 0 0 0 0 0  Risk for fall due to : No Fall Risks No Fall Risks  No Fall Risks No Fall Risks  Follow up Education provided;Falls prevention discussed Falls evaluation completed Falls evaluation completed Falls evaluation completed Falls evaluation completed    FALL RISK PREVENTION PERTAINING TO THE HOME:  Any stairs in or around the home? Yes  If so, are there any without handrails? Yes   Home free of loose throw rugs in walkways, pet beds, electrical cords, etc? Yes  Adequate lighting in your home to reduce risk of falls? Yes   ASSISTIVE DEVICES UTILIZED TO PREVENT FALLS:  Life alert? No  Use of a cane, walker or w/c? No  Grab bars in the bathroom? No  Shower chair or bench in shower? No  Elevated toilet seat or a handicapped toilet? No   TIMED UP AND GO:  Was the test performed? Yes .  Length of time to ambulate 10 feet: 8 sec.   Gait steady and fast without use of assistive device  Cognitive Function:        08/23/2022   12:10 PM 09/08/2020   10:00 AM 03/15/2016    1:21 PM  6CIT Screen  What Year? 0 points 0 points 0 points  What month? 0 points 0 points 0 points  What time? 0 points 0 points 0 points  Count back from 20 0 points 0 points 0 points  Months in reverse 0 points 2 points 4 points  Repeat phrase 0 points 2 points 4 points  Total Score 0 points 4 points 8 points    Immunizations Immunization History  Administered Date(s) Administered   Fluad Quad(high Dose 65+) 03/15/2021   Influenza, High Dose Seasonal PF 03/15/2016, 02/12/2017, 12/04/2017, 01/23/2020   Influenza,inj,Quad PF,6+ Mos 12/20/2018, 01/28/2019   Influenza-Unspecified 12/20/2018   PFIZER(Purple Top)SARS-COV-2 Vaccination 04/07/2019, 04/28/2019   Pneumococcal Conjugate-13 09/12/2013   Pneumococcal Polysaccharide-23 02/27/2011    TDAP status: Up to date  Flu Vaccine status: Up to date  Pneumococcal vaccine status: Up to date  Covid-19 vaccine status: Completed vaccines  Qualifies for Shingles Vaccine? Yes   Zostavax completed No   Shingrix Completed?: No.    Education has  been provided regarding the importance of this vaccine. Patient has been advised to call insurance company to determine out of pocket expense if they have not yet received this vaccine. Advised may also receive vaccine at local pharmacy or Health Dept. Verbalized acceptance and  understanding.  Screening Tests Health Maintenance  Topic Date Due   DTaP/Tdap/Td (1 - Tdap) Never done   Zoster Vaccines- Shingrix (1 of 2) Never done   COVID-19 Vaccine (3 - Pfizer risk series) 05/26/2019   DEXA SCAN  09/29/2022   INFLUENZA VACCINE  10/19/2022   Medicare Annual Wellness (AWV)  08/23/2023   Pneumonia Vaccine 14+ Years old  Completed   HPV VACCINES  Aged Out    Health Maintenance  Health Maintenance Due  Topic Date Due   DTaP/Tdap/Td (1 - Tdap) Never done   Zoster Vaccines- Shingrix (1 of 2) Never done   COVID-19 Vaccine (3 - Pfizer risk series) 05/26/2019    Colorectal cancer screening: No longer required.   Mammogram status: No longer required due to age.  Bone Density status: Completed yes. Results reflect: Bone density results: OSTEOPOROSIS. Repeat every 3 years.  Lung Cancer Screening: (Low Dose CT Chest recommended if Age 18-80 years, 30 pack-year currently smoking OR have quit w/in 15years.) does not qualify.   Lung Cancer Screening Referral: no  Additional Screening:  Hepatitis C Screening: does not qualify; Completed yes  Vision Screening: Recommended annual ophthalmology exams for early detection of glaucoma and other disorders of the eye. Is the patient up to date with their annual eye exam?  Yes  Who is the provider or what is the name of the office in which the patient attends annual eye exams? Does not know.influenza [place] If pt is not established with a provider, would they like to be referred to a provider to establish care? No .   Dental Screening: Recommended annual dental exams for proper oral hygiene  Community Resource Referral / Chronic Care Management: CRR required this visit?  No   CCM required this visit?  No     Plan:     I have personally reviewed and noted the following in the patient's chart:   Medical and social history Use of alcohol, tobacco or illicit drugs  Current medications and supplements including  opioid prescriptions. Patient is not currently taking opioid prescriptions. Functional ability and status Nutritional status Physical activity Advanced directives List of other physicians Hospitalizations, surgeries, and ER visits in previous 12 months Vitals Screenings to include cognitive, depression, and falls Referrals and appointments  In addition, I have reviewed and discussed with patient certain preventive protocols, quality metrics, and best practice recommendations. A written personalized care plan for preventive services as well as general preventive health recommendations were provided to patient.     Sue Lush, LPN   03/25/1094   Nurse Notes: The patient states she is doing well and has no concerns or questions at this time.

## 2022-09-09 ENCOUNTER — Other Ambulatory Visit: Payer: Self-pay | Admitting: Family Medicine

## 2022-09-09 DIAGNOSIS — M810 Age-related osteoporosis without current pathological fracture: Secondary | ICD-10-CM

## 2022-09-11 NOTE — Telephone Encounter (Signed)
Requested Prescriptions  Pending Prescriptions Disp Refills   alendronate (FOSAMAX) 70 MG tablet [Pharmacy Med Name: ALENDRONATE SODIUM 70 MG TAB] 12 tablet 0    Sig: TAKE 1 TABLET BY MOUTH EVERY 7 (SEVEN) DAYS. TAKE WITH A FULL GLASS OF WATER ON AN EMPTY STOMACH.     Endocrinology:  Bisphosphonates Failed - 09/09/2022  1:01 AM      Failed - Ca in normal range and within 360 days    Calcium  Date Value Ref Range Status  07/20/2021 9.3 8.7 - 10.3 mg/dL Final         Failed - Vitamin D in normal range and within 360 days    Vit D, 25-Hydroxy  Date Value Ref Range Status  03/15/2021 98.2 30.0 - 100.0 ng/mL Final    Comment:    Vitamin D deficiency has been defined by the Institute of Medicine and an Endocrine Society practice guideline as a level of serum 25-OH vitamin D less than 20 ng/mL (1,2). The Endocrine Society went on to further define vitamin D insufficiency as a level between 21 and 29 ng/mL (2). 1. IOM (Institute of Medicine). 2010. Dietary reference    intakes for calcium and D. [place]: The    [employer]. 2. Holick MF, Binkley [place], Bischoff-Ferrari HA, et al.    Evaluation, treatment, and prevention of vitamin D    deficiency: an Endocrine Society clinical practice    guideline. JCEM. 2011 Jul; 96(7):1911-30.          Failed - Mg Level in normal range and within 360 days    No results found for: "MG"       Failed - Phosphate in normal range and within 360 days    No results found for: "PHOS"       Failed - eGFR is 30 or above and within 360 days    GFR, Est African American  Date Value Ref Range Status  02/23/2017 96 > OR = 60 mL/min/1.55m2 Final   GFR calc Af Amer  Date Value Ref Range Status  09/09/2019 86 >59 mL/min/1.73 Final    Comment:    **[facility] currently reports eGFR in compliance with the current**   recommendations of the [employer]. [facility] will   update reporting as new guidelines are published from the  NKF-ASN   Task force.    GFR, Est Non African American  Date Value Ref Range Status  02/23/2017 82 > OR = 60 mL/min/1.79m2 Final   GFR calc non Af Amer  Date Value Ref Range Status  09/09/2019 75 >59 mL/min/1.73 Final   eGFR  Date Value Ref Range Status  07/20/2021 86 >59 mL/min/1.73 Final         Passed - Cr in normal range and within 360 days    Creat  Date Value Ref Range Status  02/23/2017 0.70 0.60 - 0.93 mg/dL Final    Comment:    For patients >64 years of age, the reference limit for Creatinine is approximately 13% higher for people identified as African-American. .    Creatinine, Ser  Date Value Ref Range Status  12/29/2021 0.70 0.44 - 1.00 mg/dL Final         Passed - Valid encounter within last 12 months    Recent Outpatient Visits           9 months ago Mass of hip region, left   [facility] Malva Limes, MD   1 year ago  Dizziness   [place] [facility] Malva Limes, MD   1 year ago Essential (primary) hypertension   [facility] [facility] Malva Limes, MD   2 years ago Annual physical exam   [facility] Malva Limes, MD   2 years ago Essential (primary) hypertension   [place] [facility] Malva Limes, MD              Passed - Bone Mineral Density or Dexa Scan completed in the last 2 years

## 2022-11-08 DIAGNOSIS — Z961 Presence of intraocular lens: Secondary | ICD-10-CM | POA: Diagnosis not present

## 2022-11-08 DIAGNOSIS — H35039 Hypertensive retinopathy, unspecified eye: Secondary | ICD-10-CM | POA: Diagnosis not present

## 2022-11-08 DIAGNOSIS — M3501 Sicca syndrome with keratoconjunctivitis: Secondary | ICD-10-CM | POA: Diagnosis not present

## 2022-12-02 ENCOUNTER — Other Ambulatory Visit: Payer: Self-pay | Admitting: Family Medicine

## 2022-12-02 DIAGNOSIS — M810 Age-related osteoporosis without current pathological fracture: Secondary | ICD-10-CM

## 2022-12-04 IMAGING — US US CAROTID DUPLEX BILAT
1 series · 13 of 24 positions shown · non-contrast
Comparison: None Available.

CLINICAL DATA: Dizziness, TIA symptoms

EXAM:
BILATERAL CAROTID DUPLEX ULTRASOUND
TECHNIQUE: Gray scale imaging, color Doppler and duplex ultrasound were
performed of bilateral carotid and vertebral arteries in the neck.

[Series 1: us carotid duplex bilat · 0.05mm/px · 13 of 62 slices shown]
[im 1/62]
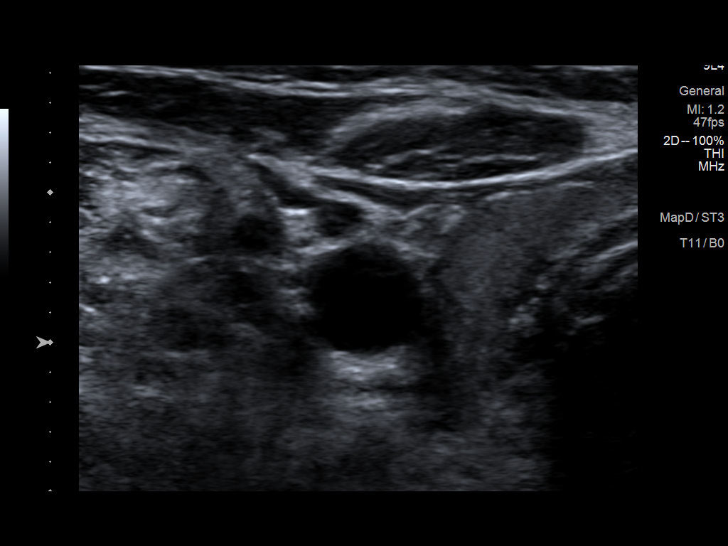
[im 6/62]
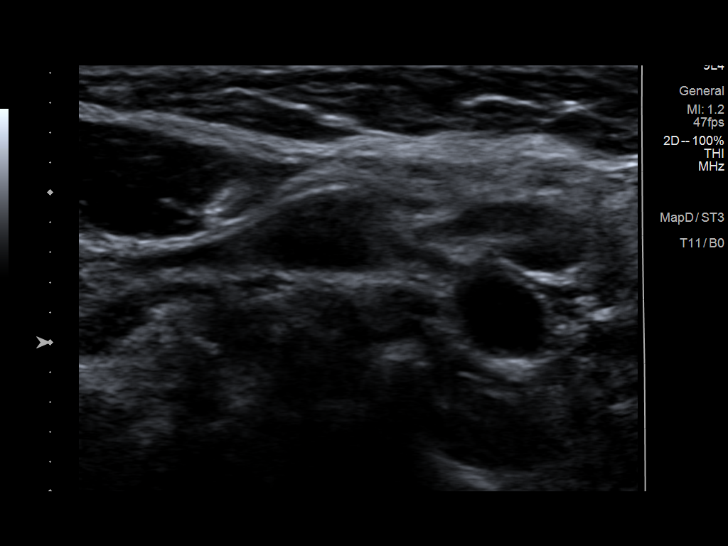
[im 11/62]
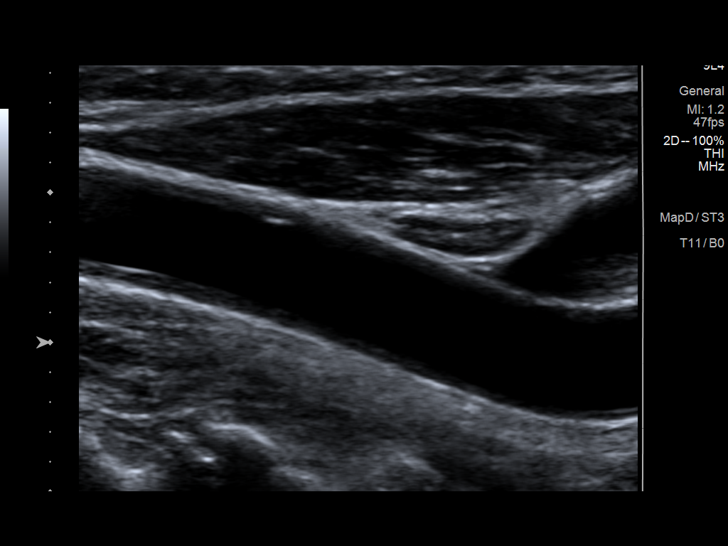
[im 16/62]
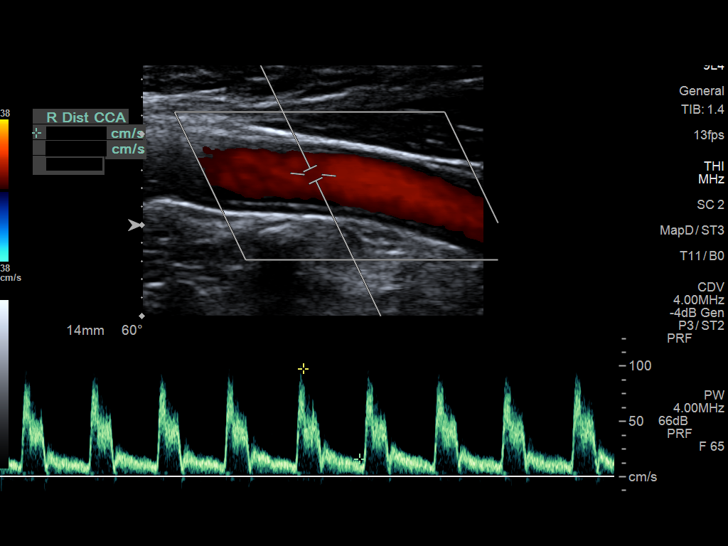
[im 22/62]
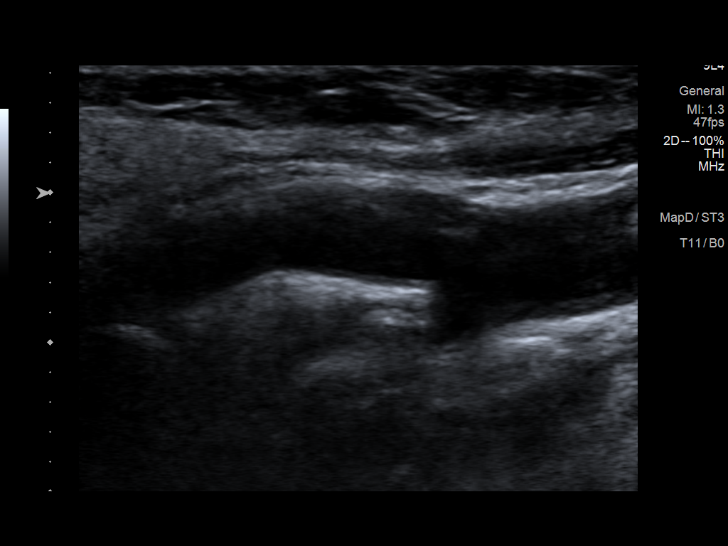
[im 27/62]
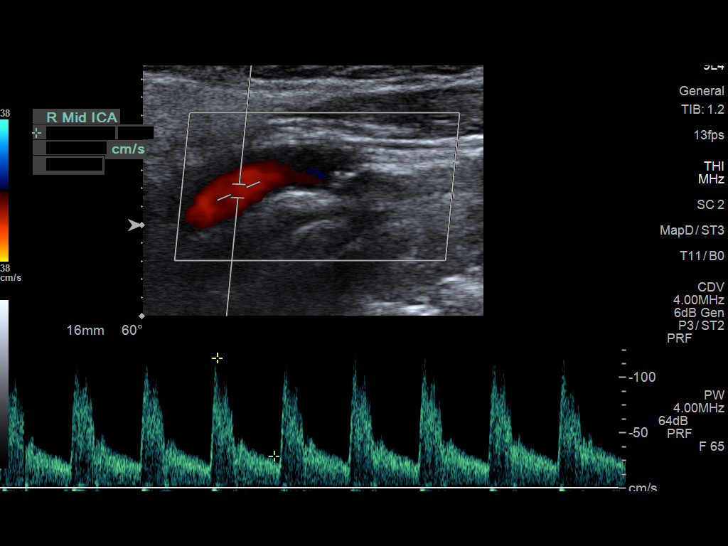
[im 32/62]
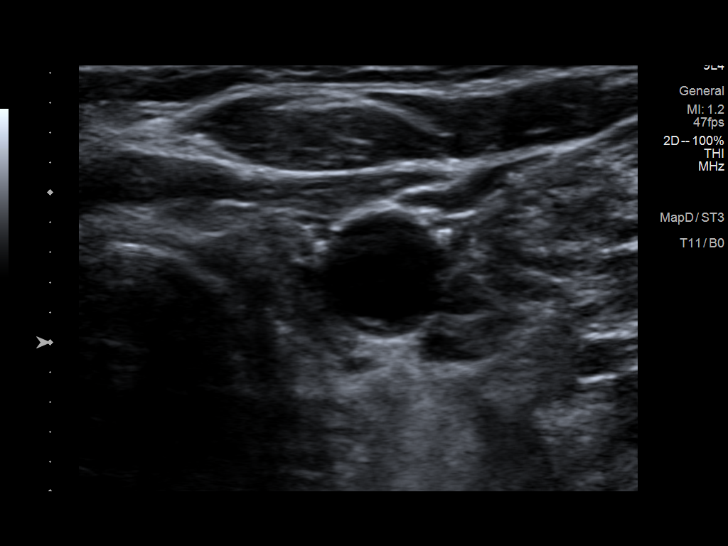
[im 35/62]
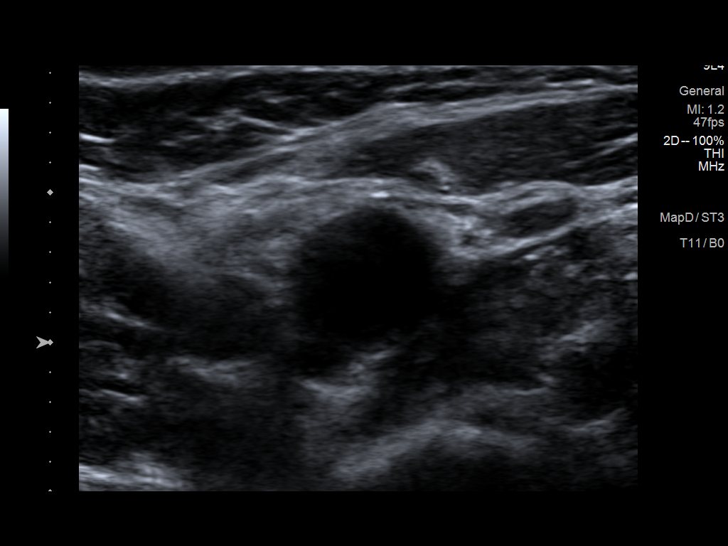
[im 40/62]
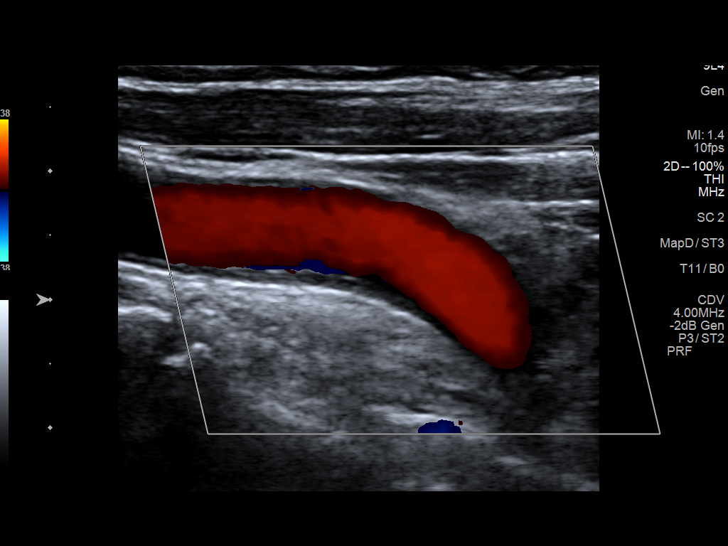
[im 46/62]
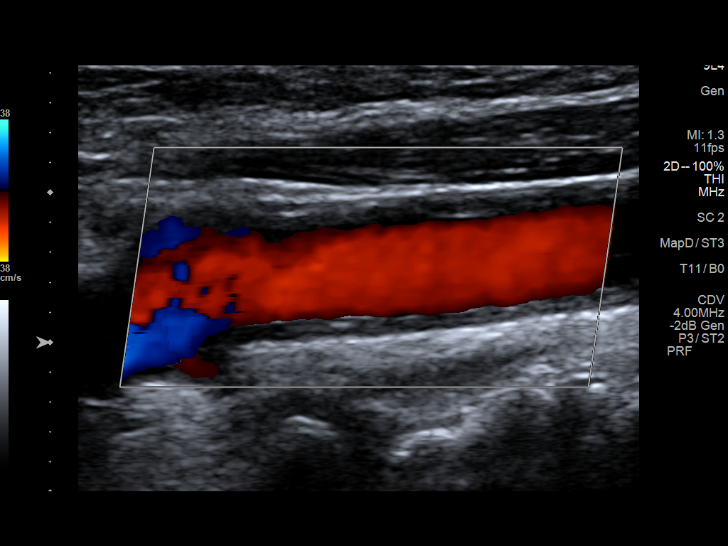
[im 51/62]
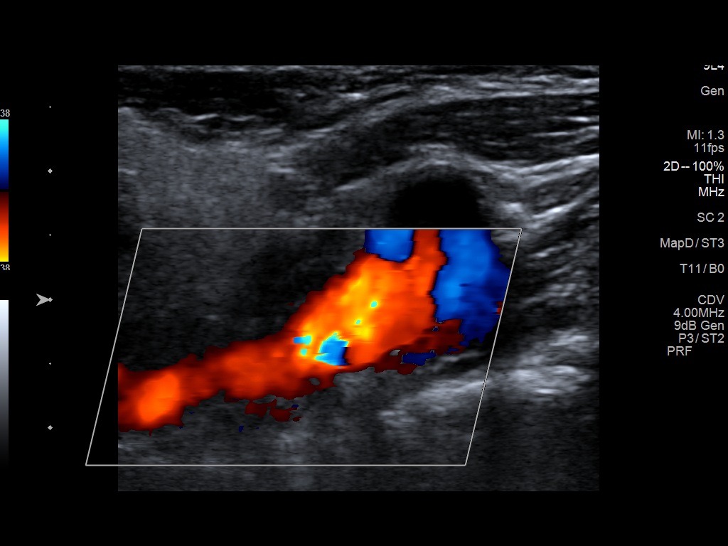
[im 56/62]
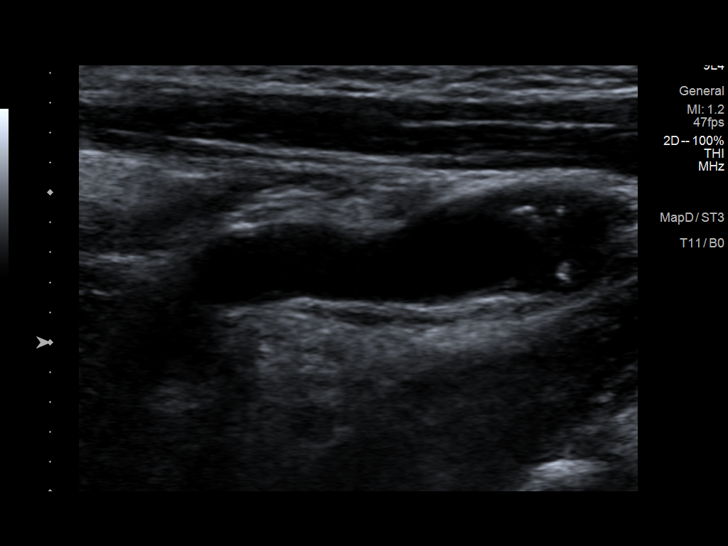
[im 62/62]
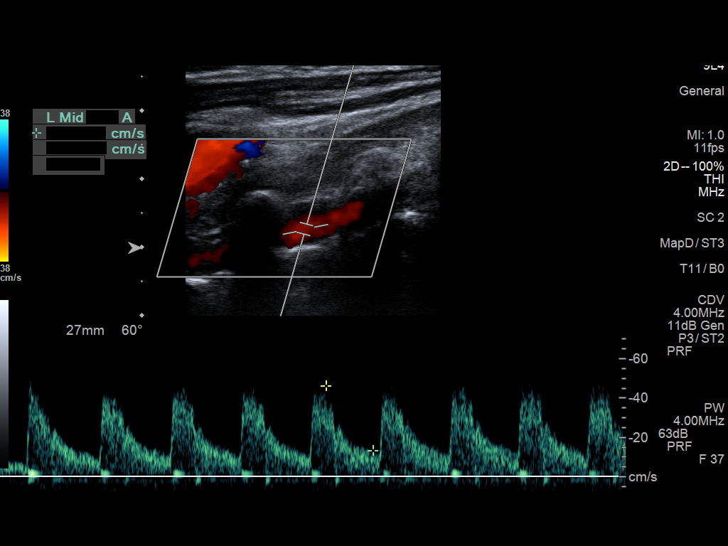

[13 of 24 positions shown; findings below may reference images not displayed]

FINDINGS: Criteria: Quantification of carotid stenosis is based on velocity
parameters that correlate the residual internal carotid diameter
with NASCET-based stenosis levels, using the diameter of the distal
internal carotid lumen as the denominator for stenosis measurement.

The following velocity measurements were obtained:

RIGHT

ICA: 129/33 cm/sec

CCA: 98/16 cm/sec

SYSTOLIC ICA/CCA RATIO:

ECA: 118 cm/sec

LEFT

ICA: 143/34 cm/sec

CCA: 87/20 cm/sec

SYSTOLIC ICA/CCA RATIO:

ECA: 109 cm/sec

RIGHT CAROTID ARTERY: Intimal thickening and minor thin
atherosclerotic change. Negative for stenosis, velocity elevation,
or turbulent flow. Degree of narrowing estimated less than 50% by
ultrasound criteria.

RIGHT VERTEBRAL ARTERY:  Normal antegrade flow

LEFT CAROTID ARTERY: Intimal thickening and minor atherosclerotic
change. Negative for stenosis, velocity elevation, or turbulent
flow. Degree of narrowing less than 50% by ultrasound criteria.

LEFT VERTEBRAL ARTERY:  Normal antegrade flow
IMPRESSION: Bilateral carotid atherosclerosis. Negative for stenosis. Degree of
narrowing less than 50% bilaterally by ultrasound criteria.

Patent antegrade vertebral flow bilaterally

## 2022-12-04 NOTE — Telephone Encounter (Signed)
Requested medication (s) are due for refill today - yes  Requested medication (s) are on the active medication list -yes  Future visit scheduled -no  Last refill: 09/11/22 #12  Notes to clinic: fails lab protocol- over 1 year-07/20/21  Requested Prescriptions  Pending Prescriptions Disp Refills   alendronate (FOSAMAX) 70 MG tablet [Pharmacy Med Name: ALENDRONATE SODIUM 70 MG TAB] 12 tablet 0    Sig: TAKE 1 TABLET BY MOUTH EVERY 7 (SEVEN) DAYS. TAKE WITH A FULL GLASS OF WATER ON AN EMPTY STOMACH.     Endocrinology:  Bisphosphonates Failed - 12/02/2022  1:05 AM      Failed - Ca in normal range and within 360 days    Calcium  Date Value Ref Range Status  07/20/2021 9.3 8.7 - 10.3 mg/dL Final         Failed - Vitamin D in normal range and within 360 days    Vit D, 25-Hydroxy  Date Value Ref Range Status  03/15/2021 98.2 30.0 - 100.0 ng/mL Final    Comment:    Vitamin D deficiency has been defined by the Institute of Medicine and an Endocrine Society practice guideline as a level of serum 25-OH vitamin D less than 20 ng/mL (1,2). The Endocrine Society went on to further define vitamin D insufficiency as a level between 21 and 29 ng/mL (2). 1. IOM (Institute of Medicine). 2010. Dietary reference    intakes for calcium and D. [place]: The    [employer]. 2. Holick MF, Binkley , Bischoff-Ferrari HA, et al.    Evaluation, treatment, and prevention of vitamin D    deficiency: an Endocrine Society clinical practice    guideline. JCEM. 2011 Jul; 96(7):1911-30.          Failed - Mg Level in normal range and within 360 days    No results found for: "MG"       Failed - Phosphate in normal range and within 360 days    No results found for: "PHOS"       Failed - eGFR is 30 or above and within 360 days    GFR, Est African American  Date Value Ref Range Status  02/23/2017 96 > OR = 60 mL/min/1.57m2 Final   GFR calc Af Amer  Date Value Ref Range Status  09/09/2019  86 >59 mL/min/1.73 Final    Comment:    **[facility] currently reports eGFR in compliance with the current**   recommendations of the [employer]. [facility] will   update reporting as new guidelines are published from the NKF-ASN   Task force.    GFR, Est Non African American  Date Value Ref Range Status  02/23/2017 82 > OR = 60 mL/min/1.32m2 Final   GFR calc non Af Amer  Date Value Ref Range Status  09/09/2019 75 >59 mL/min/1.73 Final   eGFR  Date Value Ref Range Status  07/20/2021 86 >59 mL/min/1.73 Final         Failed - Bone Mineral Density or Dexa Scan completed in the last 2 years      Passed - Cr in normal range and within 360 days    Creat  Date Value Ref Range Status  02/23/2017 0.70 0.60 - 0.93 mg/dL Final    Comment:    For patients >70 years of age, the reference limit for Creatinine is approximately 13% higher for people identified as African-American. .    Creatinine, Ser  Date Value Ref Range Status  12/29/2021 0.70  0.44 - 1.00 mg/dL Final         Passed - Valid encounter within last 12 months    Recent Outpatient Visits           11 months ago Mass of hip region, left   [facility] Malva Limes, MD   1 year ago Dizziness   [place] [facility] Malva Limes, MD   1 year ago Essential (primary) hypertension   [facility] Malva Limes, MD   2 years ago Annual physical exam   [facility] Malva Limes, MD   3 years ago Essential (primary) hypertension   [place] [facility] Malva Limes, MD                 Requested Prescriptions  Pending Prescriptions Disp Refills   alendronate (FOSAMAX) 70 MG tablet [Pharmacy Med Name: ALENDRONATE SODIUM 70 MG TAB] 12 tablet 0    Sig: TAKE 1 TABLET BY MOUTH EVERY 7 (SEVEN) DAYS. TAKE WITH A FULL GLASS OF WATER ON AN EMPTY STOMACH.     Endocrinology:   Bisphosphonates Failed - 12/02/2022  1:05 AM      Failed - Ca in normal range and within 360 days    Calcium  Date Value Ref Range Status  07/20/2021 9.3 8.7 - 10.3 mg/dL Final         Failed - Vitamin D in normal range and within 360 days    Vit D, 25-Hydroxy  Date Value Ref Range Status  03/15/2021 98.2 30.0 - 100.0 ng/mL Final    Comment:    Vitamin D deficiency has been defined by the Institute of Medicine and an Endocrine Society practice guideline as a level of serum 25-OH vitamin D less than 20 ng/mL (1,2). The Endocrine Society went on to further define vitamin D insufficiency as a level between 21 and 29 ng/mL (2). 1. IOM (Institute of Medicine). 2010. Dietary reference    intakes for calcium and D. [place]: The    [employer]. 2. Holick MF, Binkley [place], Bischoff-Ferrari HA, et al.    Evaluation, treatment, and prevention of vitamin D    deficiency: an Endocrine Society clinical practice    guideline. JCEM. 2011 Jul; 96(7):1911-30.          Failed - Mg Level in normal range and within 360 days    No results found for: "MG"       Failed - Phosphate in normal range and within 360 days    No results found for: "PHOS"       Failed - eGFR is 30 or above and within 360 days    GFR, Est African American  Date Value Ref Range Status  02/23/2017 96 > OR = 60 mL/min/1.76m2 Final   GFR calc Af Amer  Date Value Ref Range Status  09/09/2019 86 >59 mL/min/1.73 Final    Comment:    **[facility] currently reports eGFR in compliance with the current**   recommendations of the [employer]. [facility] will   update reporting as new guidelines are published from the NKF-ASN   Task force.    GFR, Est Non African American  Date Value Ref Range Status  02/23/2017 82 > OR = 60 mL/min/1.95m2 Final   GFR calc non Af Amer  Date Value Ref Range Status  09/09/2019 75 >59 mL/min/1.73 Final   eGFR  Date Value Ref Range  Status  07/20/2021 86 >59  mL/min/1.73 Final         Failed - Bone Mineral Density or Dexa Scan completed in the last 2 years      Passed - Cr in normal range and within 360 days    Creat  Date Value Ref Range Status  02/23/2017 0.70 0.60 - 0.93 mg/dL Final    Comment:    For patients >64 years of age, the reference limit for Creatinine is approximately 13% higher for people identified as African-American. .    Creatinine, Ser  Date Value Ref Range Status  12/29/2021 0.70 0.44 - 1.00 mg/dL Final         Passed - Valid encounter within last 12 months    Recent Outpatient Visits           11 months ago Mass of hip region, left   [facility] Malva Limes, MD   1 year ago Dizziness   [place] [facility] Malva Limes, MD   1 year ago Essential (primary) hypertension   [facility] Malva Limes, MD   2 years ago Annual physical exam   [facility] Malva Limes, MD   3 years ago Essential (primary) hypertension   [facility] Malva Limes, MD

## 2022-12-28 ENCOUNTER — Encounter: Payer: Self-pay | Admitting: Podiatry

## 2022-12-28 ENCOUNTER — Ambulatory Visit: Payer: PPO | Admitting: Podiatry

## 2022-12-28 VITALS — BP 165/71 | HR 80

## 2022-12-28 DIAGNOSIS — M722 Plantar fascial fibromatosis: Secondary | ICD-10-CM | POA: Diagnosis not present

## 2022-12-28 DIAGNOSIS — M62461 Contracture of muscle, right lower leg: Secondary | ICD-10-CM

## 2022-12-28 NOTE — Progress Notes (Signed)
Subjective:  Patient ID: Sophia Anderson, female    DOB: [birth year],  MRN: [id number]  Chief Complaint  Patient presents with   Foot Pain    "My heel is hurting.  In the morning when I get up, it hurts.  I have that ugly bone (bunion) on there too."    87 y.o. female presents with the above complaint.  Patient presents with right heel pain that has been going on for quite some time hurts in the morning post attic dyskinesia symptoms noted.  Pain scale is 6 out of 10 sharp shooting in nature hurts with ambulation worse with pressure she has not seen MRIs prior to seeing me denies any other acute complaints.   Review of Systems: Negative except as noted in the HPI. Denies N/V/F/Ch.  Past Medical History:  Diagnosis Date   Hyperlipidemia    Hypertension    Thyroid disease     Current Outpatient Medications:    amLODipine (NORVASC) 5 MG tablet, TAKE 1 TABLET BY MOUTH EVERY DAY, Disp: 90 tablet, Rfl: 4   levothyroxine (SYNTHROID) 88 MCG tablet, TAKE 1 TABLET BY MOUTH EVERY DAY, Disp: 90 tablet, Rfl: 4   albuterol (PROAIR HFA) 108 (90 Base) MCG/ACT inhaler, Inhale 2 puffs into the lungs every 4 (four) hours as needed. (Patient not taking: Reported on 12/28/2022), Disp: 1 each, Rfl: 5   alendronate (FOSAMAX) 70 MG tablet, TAKE 1 TABLET BY MOUTH EVERY 7 (SEVEN) DAYS. TAKE WITH A FULL GLASS OF WATER ON AN EMPTY STOMACH. (Patient not taking: Reported on 12/28/2022), Disp: 12 tablet, Rfl: 0   ezetimibe (ZETIA) 10 MG tablet, TAKE 1 TABLET BY MOUTH EVERYDAY AT BEDTIME (Patient not taking: Reported on 12/28/2022), Disp: 90 tablet, Rfl: 4   triamcinolone (NASACORT) 55 MCG/ACT AERO nasal inhaler, Place into the nose. (Patient not taking: Reported on 08/23/2022), Disp: , Rfl:    Vitamin D, Ergocalciferol, (DRISDOL) 1.25 MG (50000 UNIT) CAPS capsule, TAKE 1 CAPSULE BY MOUTH ONE TIME PER WEEK (Patient not taking: Reported on 12/28/2022), Disp: 12 capsule, Rfl: 4  Social History   Tobacco Use  Smoking  Status Former   Types: Cigarettes  Smokeless Tobacco Never  Tobacco Comments   Smoked < 1 ppd and quit 30+ years ago    Allergies  Allergen Reactions   Fluvastatin Sodium     Heartburn   Penicillins    Welchol  [Colesevelam Hcl]     Heartburn   Propranolol     Trouble with memory   Objective:   Vitals:   12/28/22 1344  BP: (!) 165/71  Pulse: 80   There is no height or weight on file to calculate BMI. Constitutional Well developed. Well nourished.  Vascular Dorsalis pedis pulses palpable bilaterally. Posterior tibial pulses palpable bilaterally. Capillary refill normal to all digits.  No cyanosis or clubbing noted. Pedal hair growth normal.  Neurologic Normal speech. Oriented to person, place, and time. Epicritic sensation to light touch grossly present bilaterally.  Dermatologic Nails well groomed and normal in appearance. No open wounds. No skin lesions.  Orthopedic: Normal joint ROM without pain or crepitus bilaterally. No visible deformities. Tender to palpation at the calcaneal tuber right. No pain with calcaneal squeeze right. Ankle ROM diminished range of motion right. Silfverskiold Test: positive right.   Radiographs: None  Assessment:   1. Plantar fasciitis of right foot   2. Gastrocnemius equinus, right    Plan:  Patient was evaluated and treated and all questions answered.  Plantar Fasciitis, right  with underlying gastrocnemius equinus - XR reviewed as above.  - Educated on icing and stretching. Instructions given.  - Injection delivered to the plantar fascia as below. - DME: Plantar fascial brace dispensed to support the medial longitudinal arch of the foot and offload pressure from the heel and prevent arch collapse during weightbearing - Pharmacologic management: None  Procedure: Injection Tendon/Ligament Location: Right plantar fascia at the glabrous junction; medial approach. Skin Prep: alcohol Injectate: 0.5 cc 0.5% marcaine plain, 0.5  cc of 1% Lidocaine, 0.5 cc kenalog 10. Disposition: Patient tolerated procedure well. Injection site dressed with a band-aid.  No follow-ups on file.

## 2023-02-24 ENCOUNTER — Other Ambulatory Visit: Payer: Self-pay | Admitting: Family Medicine

## 2023-02-24 DIAGNOSIS — M810 Age-related osteoporosis without current pathological fracture: Secondary | ICD-10-CM

## 2023-03-27 ENCOUNTER — Other Ambulatory Visit: Payer: Self-pay | Admitting: Family Medicine

## 2023-08-30 ENCOUNTER — Ambulatory Visit (INDEPENDENT_AMBULATORY_CARE_PROVIDER_SITE_OTHER)

## 2023-08-30 DIAGNOSIS — Z0000 Encounter for general adult medical examination without abnormal findings: Secondary | ICD-10-CM

## 2023-08-30 NOTE — Progress Notes (Signed)
 Subjective:   Sophia Anderson is a 88 y.o. who presents for a Medicare Wellness preventive visit.  As a reminder, Annual Wellness Visits don't include a physical exam, and some assessments may be limited, especially if this visit is performed virtually. We may recommend an in-person follow-up visit with your provider if needed.  Visit Complete: Virtual I connected with  [name] on 08/30/23 by a audio enabled telemedicine application and verified that I am speaking with the correct person using two identifiers.  Patient Location: Home  Provider Location: Office/Clinic  I discussed the limitations of evaluation and management by telemedicine. The patient expressed understanding and agreed to proceed.  Vital Signs: Because this visit was a virtual/telehealth visit, some criteria may be missing or patient reported. Any vitals not documented were not able to be obtained and vitals that have been documented are patient reported.  VideoDeclined- This patient declined Librarian, academic. Therefore the visit was completed with audio only.  Persons Participating in Visit: Patient.  AWV Questionnaire: No: Patient Medicare AWV questionnaire was not completed prior to this visit.  Cardiac Risk Factors include: advanced age (>39men, >57 women);hypertension;dyslipidemia     Objective:    There were no vitals filed for this visit. There is no height or weight on file to calculate BMI.     08/30/2023    3:53 PM 08/23/2022   12:03 PM 04/12/2021   10:28 AM 02/22/2017    2:15 PM 03/15/2016    1:15 PM  Advanced Directives  Does Patient Have a Medical Advance Directive? No Yes No Yes  Yes   Type of Special educational needs teacher of [place];Living will  Healthcare Power of [place];Living will Living will;Healthcare Power of Attorney  Copy of Healthcare Power of Attorney in Chart?    No - copy requested  No - copy requested   Would patient like information on  creating a medical advance directive? No - Patient declined         Data saved with a previous flowsheet row definition    Current Medications (verified) Outpatient Encounter Medications as of 08/30/2023  Medication Sig   albuterol  (PROAIR  HFA) 108 (90 Base) MCG/ACT inhaler Inhale 2 puffs into the lungs every 4 (four) hours as needed.   levothyroxine (SYNTHROID) 88 MCG tablet TAKE 1 TABLET BY MOUTH EVERY DAY   triamcinolone  (NASACORT) 55 MCG/ACT AERO nasal inhaler Place into the nose.   Vitamin D , Ergocalciferol , (DRISDOL) 1.25 MG (50000 UNIT) CAPS capsule TAKE 1 CAPSULE BY MOUTH ONE TIME PER WEEK   alendronate  (FOSAMAX ) 70 MG tablet TAKE 1 TABLET BY MOUTH EVERY 7 (SEVEN) DAYS. TAKE WITH A FULL GLASS OF WATER ON AN EMPTY STOMACH. (Patient not taking: Reported on 08/30/2023)   amLODipine  (NORVASC ) 5 MG tablet TAKE 1 TABLET BY MOUTH EVERY DAY (Patient not taking: Reported on 08/30/2023)   ezetimibe (ZETIA) 10 MG tablet TAKE 1 TABLET BY MOUTH EVERYDAY AT BEDTIME (Patient not taking: Reported on 08/30/2023)   No facility-administered encounter medications on file as of 08/30/2023.    Allergies (verified) Fluvastatin sodium, Penicillins, Welchol  [colesevelam hcl], and Propranolol   History: Past Medical History:  Diagnosis Date   Hyperlipidemia    Hypertension    Thyroid  disease    Past Surgical History:  Procedure Laterality Date   ABDOMINAL HYSTERECTOMY  1982   vaginal and oophorectomy with incidental appendectomy   APPENDECTOMY     accidental durin hysterectomy and oophorectomy   CATARACT EXTRACTION W/PHACO  Right 04/12/2021   Procedure: CATARACT EXTRACTION PHACO AND INTRAOCULAR LENS PLACEMENT (IOC) RIGHT 4.13 00:30.1;  Surgeon: Clair Crews, MD;  Location: [facility];  Service: Ophthalmology;  Laterality: Right;   EYE SURGERY     left eye cataract surgery   Family History  Problem Relation Age of Onset   Emphysema Father    Congestive Heart Failure Sister    Stroke  Sister    Breast cancer Sister    Heart attack Brother    Social History   Socioeconomic History   Marital status: Widowed    Spouse name: Not on file   Number of children: 1   Years of education: Not on file   Highest education level: Not on file  Occupational History   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, music  Tobacco Use   Smoking status: Former    Types: Cigarettes   Smokeless tobacco: Never   Tobacco comments:    Smoked < 1 ppd and quit 30+ years ago  Vaping Use   Vaping status: Never Used  Substance and Sexual Activity   Alcohol use: Not Currently    Alcohol/week: 0.0 standard drinks of alcohol   Drug use: No   Sexual activity: Not on file  Other Topics Concern   Not on file  Social History Narrative   Not on file   Social Drivers of Health   Financial Resource Strain: Low Risk  (08/30/2023)   Overall Financial Resource Strain (CARDIA)    Difficulty of Paying Living Expenses: Not hard at all  Food Insecurity: No Food Insecurity (08/30/2023)   Hunger Vital Sign    Worried About Running Out of Food in the Last Year: Never true    Ran Out of Food in the Last Year: Never true  Transportation Needs: No Transportation Needs (08/30/2023)   PRAPARE - Administrator, Civil Service (Medical): No    Lack of Transportation (Non-Medical): No  Physical Activity: Sufficiently Active (08/30/2023)   Exercise Vital Sign    Days of Exercise per Week: 7 days    Minutes of Exercise per Session: 40 min  Stress: No Stress Concern Present (08/30/2023)   [employer] of Occupational Health - Occupational Stress Questionnaire    Feeling of Stress: Not at all  Social Connections: Moderately Integrated (08/30/2023)   Social Connection and Isolation Panel    Frequency of Communication with Friends and Family: More than three times a week    Frequency of Social Gatherings with Friends and Family: More than three times a week    Attends  Religious Services: 1 to 4 times per year    Active Member of [employer] or Organizations: Yes    Attends Banker Meetings: More than 4 times per year    Marital Status: Widowed    Tobacco Counseling Counseling given: Not Answered Tobacco comments: Smoked < 1 ppd and quit 30+ years ago    Clinical Intake:  Pre-visit preparation completed: Yes  Pain : No/denies pain     BMI - recorded: 23.03 Nutritional Status: BMI of 19-24  Normal Nutritional Risks: None Diabetes: No  Lab Results  Component Value Date   HGBA1C 5.6 03/15/2021   HGBA1C 5.7 (H) 02/23/2017   HGBA1C 5.9 09/27/2015     How often do you need to have someone help you when you read instructions, pamphlets, or other written materials from your doctor or pharmacy?: 1 - Never  Interpreter Needed?: No  Information entered by :: Dellie Fergusson, LPN   Activities of Daily Living    08/30/2023    3:54 PM  In your present state of health, do you have any difficulty performing the following activities:  Hearing? 0  Vision? 0  Difficulty concentrating or making decisions? 0  Comment MEMORY NOT AS GOOD AS IT USED TO BE  Walking or climbing stairs? 0  Dressing or bathing? 0  Doing errands, shopping? 0  Preparing Food and eating ? N  Using the Toilet? N  In the past six months, have you accidently leaked urine? Y  Do you have problems with loss of bowel control? N  Managing your Medications? N  Managing your Finances? N  Housekeeping or managing your Housekeeping? N    Patient Care Team: Lamon Pillow, MD as PCP - General (Family Medicine) Clemetine Cypher, DPM as Consulting Physician (Podiatry) Pa, [facility] (Optometry)  I have updated your Care Teams any recent Medical Services you may have received from other providers in the past year.     Assessment:   This is a routine wellness examination for Serra.  Hearing/Vision screen Hearing Screening - Comments:: NO AIDS Vision Screening -  Comments:: READERS- [facility]   Goals Addressed             This Visit's Progress    DIET - EAT MORE FRUITS AND VEGETABLES         Depression Screen     08/30/2023    3:51 PM 08/23/2022   11:59 AM 12/13/2021    1:29 PM 07/20/2021   11:31 AM 03/15/2021   10:10 AM 09/08/2020   11:06 AM 09/09/2019   10:35 AM  PHQ 2/9 Scores  PHQ - 2 Score 0 0 1 0 0 0 0  PHQ- 9 Score 0  4 2  3      Fall Risk     08/30/2023    3:54 PM 08/23/2022   11:56 AM 12/13/2021    1:29 PM 07/20/2021   11:31 AM 03/15/2021   10:10 AM  Fall Risk   Falls in the past year? 0 0 0 0 0  Number falls in past yr: 0 0 0 0 0  Injury with Fall? 0 0 0 0 0  Risk for fall due to : No Fall Risks No Fall Risks No Fall Risks  No Fall Risks  Follow up Falls evaluation completed Education provided;Falls prevention discussed Falls evaluation completed  Falls evaluation completed  Falls evaluation completed      Data saved with a previous flowsheet row definition    MEDICARE RISK AT HOME:  Medicare Risk at Home Any stairs in or around the home?: Yes If so, are there any without handrails?: No Home free of loose throw rugs in walkways, pet beds, electrical cords, etc?: Yes Adequate lighting in your home to reduce risk of falls?: Yes Life alert?: No Use of a cane, walker or w/c?: No Grab bars in the bathroom?: Yes Shower chair or bench in shower?: No Elevated toilet seat or a handicapped toilet?: No  TIMED UP AND GO:  Was the test performed?  No  Cognitive Function: 6CIT completed        08/23/2022   12:10 PM 09/08/2020   10:00 AM 03/15/2016    1:21 PM  6CIT Screen  What Year? 0 points 0 points 0 points  What month? 0 points 0 points 0 points  What time? 0 points  0 points 0 points  Count back from 20 0 points 0 points 0 points  Months in reverse 0 points 2 points 4 points  Repeat phrase 0 points 2 points 4 points  Total Score 0 points 4 points 8 points    Immunizations Immunization History  Administered  Date(s) Administered   Fluad Quad(high Dose 65+) 03/15/2021   Influenza, High Dose Seasonal PF 03/15/2016, 02/12/2017, 12/04/2017, 01/23/2020   Influenza,inj,Quad PF,6+ Mos 12/20/2018, 01/28/2019   Influenza-Unspecified 12/20/2018   PFIZER(Purple Top)SARS-COV-2 Vaccination 04/07/2019, 04/28/2019   Pneumococcal Conjugate-13 09/12/2013   Pneumococcal Polysaccharide-23 02/27/2011    Screening Tests Health Maintenance  Topic Date Due   DTaP/Tdap/Td (1 - Tdap) Never done   Zoster Vaccines- Shingrix (1 of 2) Never done   COVID-19 Vaccine (3 - Pfizer risk series) 05/26/2019   DEXA SCAN  09/29/2022   INFLUENZA VACCINE  10/19/2023   Medicare Annual Wellness (AWV)  08/29/2024   Pneumococcal Vaccine: 50+ Years  Completed   HPV VACCINES  Aged Out   Meningococcal B Vaccine  Aged Out    Health Maintenance  Health Maintenance Due  Topic Date Due   DTaP/Tdap/Td (1 - Tdap) Never done   Zoster Vaccines- Shingrix (1 of 2) Never done   COVID-19 Vaccine (3 - Pfizer risk series) 05/26/2019   DEXA SCAN  09/29/2022   Health Maintenance Items Addressed: UP TO DATE ON SHOTS EXCEPT SHINGRIX & COVID- WANTS NO MORE SHOTS  Additional Screening:  Vision Screening: Recommended annual ophthalmology exams for early detection of glaucoma and other disorders of the eye. Would you like a referral to an eye doctor? No    Dental Screening: Recommended annual dental exams for proper oral hygiene  Community Resource Referral / Chronic Care Management: CRR required this visit?  No   CCM required this visit?  No   Plan:    I have personally reviewed and noted the following in the patient's chart:   Medical and social history Use of alcohol, tobacco or illicit drugs  Current medications and supplements including opioid prescriptions. Patient is not currently taking opioid prescriptions. Functional ability and status Nutritional status Physical activity Advanced directives List of other  physicians Hospitalizations, surgeries, and ER visits in previous 12 months Vitals Screenings to include cognitive, depression, and falls Referrals and appointments  In addition, I have reviewed and discussed with patient certain preventive protocols, quality metrics, and best practice recommendations. A written personalized care plan for preventive services as well as general preventive health recommendations were provided to patient.   Pinky Bright, LPN   1/61/0960   After Visit Summary: (MyChart) Due to this being a telephonic visit, the after visit summary with patients personalized plan was offered to patient via MyChart   Notes: Nothing significant to report at this time.

## 2024-03-31 ENCOUNTER — Telehealth: Payer: Self-pay

## 2024-03-31 NOTE — Telephone Encounter (Signed)
 Copied from CRM #[id number]. Topic: Clinical - Request for Lab/Test Order >> Mar 31, 2024 12:50 PM Jasmin G wrote: Reason for CRM: Pt called to request for an X-Ray due to falling recently and having a dent under her eye. Call back if needed at [phone].

## 2024-04-02 ENCOUNTER — Encounter: Payer: Self-pay | Admitting: Physician Assistant

## 2024-04-02 ENCOUNTER — Ambulatory Visit: Admitting: Physician Assistant

## 2024-04-02 ENCOUNTER — Ambulatory Visit: Payer: Self-pay

## 2024-04-02 VITALS — BP 145/62 | HR 78 | Ht 60.43 in | Wt 126.8 lb

## 2024-04-02 DIAGNOSIS — S0993XD Unspecified injury of face, subsequent encounter: Secondary | ICD-10-CM

## 2024-04-02 DIAGNOSIS — W19XXXD Unspecified fall, subsequent encounter: Secondary | ICD-10-CM

## 2024-04-02 DIAGNOSIS — M810 Age-related osteoporosis without current pathological fracture: Secondary | ICD-10-CM

## 2024-04-02 DIAGNOSIS — R220 Localized swelling, mass and lump, head: Secondary | ICD-10-CM

## 2024-04-02 NOTE — Progress Notes (Signed)
 " Established patient visit  Patient: Sophia Anderson   DOB: 03/05/1936   89 y.o. Female  MRN: [id number] Visit Date: 04/02/2024  Today's healthcare provider: Jolynn Spencer, PA-C   Chief Complaint  Patient presents with   Acute Visit    Bump below eyebrow / RN Triage // Patient fell day before christmas- face was swollen but has gone down since then, it looks like there is a bone fragment in her left eyebrow      Subjective     HPI     Acute Visit    Additional comments: Bump below eyebrow / RN Triage // Patient fell day before christmas- face was swollen but has gone down since then, it looks like there is a bone fragment in her left eyebrow      Last edited by Lilian Fitzpatrick, CMA on 04/02/2024  4:00 PM.       Discussed the use of AI scribe software for clinical note transcription with the patient, who gave verbal consent to proceed.  History of Present Illness   Patient was seen at [facility] on 03/11/2024.  Patient reports that on that day, at approximately 2:00 PM, she returned home after shopping and noticed a 65-year-old boy holding two small toys. While picking up the child and attempting to bring him to a neighbors home, the patient slid on concrete and fell face-first, then landed sideways on a cement surface.  Patient reports dizziness following the fall and describes severe head pain, stating her head felt as though it exploded. The patient has bruising on the left side of her face, including the area surrounding the left eye. She states that the eye itself does not hurt.   Pt denies headache, visual problems, dizziness, memory problems or LOC    08/30/2023    3:51 PM 08/23/2022   11:59 AM 12/13/2021    1:29 PM  Depression screen PHQ 2/9  Decreased Interest 0 0 0  Down, Depressed, Hopeless 0 0 1  PHQ - 2 Score 0 0 1  Altered sleeping 0  2  Tired, decreased energy 0  1  Change in appetite 0  0  Feeling bad or failure about yourself  0  0  Trouble  concentrating 0  0  Moving slowly or fidgety/restless 0  0  Suicidal thoughts 0  0  PHQ-9 Score 0   4   Difficult doing work/chores Not difficult at all  Not difficult at all     Data saved with a previous flowsheet row definition       No data to display          Medications: Show/hide medication list[1]  Review of Systems All negative Except see HPI       Objective    BP (!) 145/62 (BP Location: Left Arm, Patient Position: Sitting, Cuff Size: Normal)   Pulse 78   Ht 5' 0.43 (1.535 m)   Wt 126 lb 12.8 oz (57.5 kg)   SpO2 98%   BMI 24.41 kg/m     Physical Exam Vitals reviewed.  Constitutional:      General: She is not in acute distress.    Appearance: Normal appearance. She is well-developed. She is not diaphoretic.  HENT:     Head: Normocephalic and atraumatic.  Eyes:     General: No scleral icterus.    Conjunctiva/sclera: Conjunctivae normal.  Neck:     Thyroid : No thyromegaly.  Cardiovascular:     Rate and Rhythm: Normal  rate and regular rhythm.     Pulses: Normal pulses.     Heart sounds: Normal heart sounds. No murmur heard. Pulmonary:     Effort: Pulmonary effort is normal. No respiratory distress.     Breath sounds: Normal breath sounds. No wheezing, rhonchi or rales.  Musculoskeletal:     Cervical back: Neck supple.     Right lower leg: No edema.     Left lower leg: No edema.  Lymphadenopathy:     Cervical: No cervical adenopathy.  Skin:    General: Skin is warm and dry.     Findings: Bruising and lesion (left periorbital) present. No rash.  Neurological:     General: No focal deficit present.     Mental Status: She is alert and oriented to person, place, and time. Mental status is at baseline.     Cranial Nerves: No cranial nerve deficit.     Sensory: No sensory deficit.     Motor: No weakness.     Coordination: Coordination normal.     Gait: Gait normal.     Deep Tendon Reflexes: Reflexes normal.  Psychiatric:        Mood and  Affect: Mood normal.        Behavior: Behavior normal.      No results found for any visits on 04/02/24.      Assessment & Plan  Fall, subsequent encounter Facial injury, subsequent encounter (Primary) Lump on face Due to the mechanism of injury, reported head pain, dizziness during and before impact, and facial bruising, the patient will most likely require imaging for further evaluation. - CT MAXILLOFACIAL WO CONTRAST - CT brain Pt reports feeling well and played ball this morning But agreed to proceed with imaging per her niece's insistence as no evaluation was done on 03/11/24 ama. Has normal vitals except BP Will follow-up  In the setting Age-related osteoporosis without current pathological fracture Currently on fosamax  and vit D Continue current regimen - DG Bone Density; Future Will follow-up  Advised to follow-up with Dr. Gasper Of note, unclear if pt is still taking fosamax , amlodipine , zetia. Needs to proceed with imaging.  No orders of the defined types were placed in this encounter.   No follow-ups on file.   The patient was advised to call back or seek an in-person evaluation if the symptoms worsen or if the condition fails to improve as anticipated.  I discussed the assessment and treatment plan with the patient. The patient was provided an opportunity to ask questions and all were answered. The patient agreed with the plan and demonstrated an understanding of the instructions.  I, Anureet Bruington, PA-C have reviewed all documentation for this visit. The documentation on 04/02/2024  for the exam, diagnosis, procedures, and orders are all accurate and complete.  Jolynn Spencer, [facility], MMS [facility] [phone] (phone) [phone] (fax)  [facility]     [1]  Outpatient Medications Prior to Visit  Medication Sig   albuterol  (PROAIR  HFA) 108 (90 Base) MCG/ACT inhaler Inhale 2 puffs into the lungs every 4 (four) hours as  needed.   alendronate  (FOSAMAX ) 70 MG tablet TAKE 1 TABLET BY MOUTH EVERY 7 (SEVEN) DAYS. TAKE WITH A FULL GLASS OF WATER ON AN EMPTY STOMACH. (Patient not taking: Reported on 08/30/2023)   amLODipine  (NORVASC ) 5 MG tablet TAKE 1 TABLET BY MOUTH EVERY DAY (Patient not taking: Reported on 08/30/2023)   ezetimibe (ZETIA) 10 MG tablet TAKE 1 TABLET BY MOUTH EVERYDAY AT  BEDTIME (Patient not taking: Reported on 08/30/2023)   levothyroxine (SYNTHROID) 88 MCG tablet TAKE 1 TABLET BY MOUTH EVERY DAY   triamcinolone  (NASACORT) 55 MCG/ACT AERO nasal inhaler Place into the nose.   Vitamin D , Ergocalciferol , (DRISDOL) 1.25 MG (50000 UNIT) CAPS capsule TAKE 1 CAPSULE BY MOUTH ONE TIME PER WEEK   No facility-administered medications prior to visit.   "

## 2024-04-02 NOTE — Telephone Encounter (Signed)
 FYI Only or Action Required?: FYI only for provider: appointment scheduled on 04/02/24.  Patient was last seen in primary care on 12/13/2021 by Gasper Nancyann BRAVO, MD.  Called Nurse Triage reporting [place].  Symptoms began a week ago.  Interventions attempted: Nothing.  Symptoms are: unchanged.  Triage Disposition: See Physician Within 24 Hours  Patient/caregiver understands and will follow disposition?:  Reason for Disposition  [1] Swelling is painful to touch AND [2] no fever  Answer Assessment - Initial Assessment Questions Pt sustained facial trauma when she fell on Christmas. States initially had a large amount of bruising and edema that resolved. States that for the past week she has noticed a pinkie fingernail size bump that is tender below left eyebrow.   1. APPEARANCE of SWELLING: What does it look like?     Bump with indent next to it 2. SIZE: How large is the swelling? (e.g., inches, cm; or compare to size of pinhead, tip of pen, eraser, coin, pea, grape, ping pong ball)      Pinkie fingernail tip sized 3. LOCATION: Where is the swelling located?     Left size below brow 4. ONSET: When did the swelling start?     One week 5. COLOR: What color is it? Is there more than one color?     Mild redness 6. PAIN: Is there any pain? If Yes, ask: How bad is the pain? (Scale 1-10; or mild, moderate, severe)       Painful only with palpation, described as sore/mild 7. ITCH: Does it itch? If Yes, ask: How bad is the itch?      Denies 8. CAUSE: What do you think caused the swelling?     Unknown 9 OTHER SYMPTOMS: Do you have any other symptoms? (e.g., fever)     Denies  Protocols used: Skin Lump or Localized Swelling-A-AH Copied from CRM #[id number]. Topic: Clinical - Red Word Triage >> Apr 02, 2024  2:11 PM Laymon HERO wrote: Red Word that prompted transfer to Nurse Triage: Granddaughter Kristina calling on line-patient on line as well-  Patient fell day before  christmas- face was swollen but has gone down since then, it looks like there is a bone fragment in her left eyebrow

## 2024-04-03 ENCOUNTER — Ambulatory Visit
Admission: RE | Admit: 2024-04-03 | Discharge: 2024-04-03 | Disposition: A | Discharge status: Home / Self Care | Source: Ambulatory Visit | Attending: Physician Assistant | Admitting: Physician Assistant

## 2024-04-03 ENCOUNTER — Ambulatory Visit: Payer: Self-pay | Admitting: Physician Assistant

## 2024-04-03 DIAGNOSIS — W19XXXD Unspecified fall, subsequent encounter: Secondary | ICD-10-CM | POA: Diagnosis not present

## 2024-04-03 DIAGNOSIS — S0993XD Unspecified injury of face, subsequent encounter: Secondary | ICD-10-CM | POA: Diagnosis not present

## 2024-04-03 DIAGNOSIS — M810 Age-related osteoporosis without current pathological fracture: Secondary | ICD-10-CM | POA: Insufficient documentation

## 2024-04-03 DIAGNOSIS — Z043 Encounter for examination and observation following other accident: Secondary | ICD-10-CM | POA: Diagnosis present

## 2024-04-03 DIAGNOSIS — R220 Localized swelling, mass and lump, head: Secondary | ICD-10-CM | POA: Diagnosis not present

## 2024-04-30 ENCOUNTER — Ambulatory Visit: Admitting: Family Medicine
